# Patient Record
Sex: Female | Born: 1985 | Race: White | Hispanic: No | Smoking: Current every day smoker
Health system: Southern US, Community
[De-identification: ages and names within clinical notes are randomized; demographics above are authoritative.]

## PROBLEM LIST (undated history)

## (undated) DIAGNOSIS — T8859XA Other complications of anesthesia, initial encounter: Secondary | ICD-10-CM

## (undated) DIAGNOSIS — K219 Gastro-esophageal reflux disease without esophagitis: Secondary | ICD-10-CM

## (undated) DIAGNOSIS — T4145XA Adverse effect of unspecified anesthetic, initial encounter: Secondary | ICD-10-CM

## (undated) HISTORY — PX: BREAST SURGERY: SHX581

## (undated) HISTORY — PX: ABDOMINAL HYSTERECTOMY: SHX81

## (undated) HISTORY — PX: WISDOM TOOTH EXTRACTION: SHX21

---

## 2004-04-22 ENCOUNTER — Ambulatory Visit: Payer: Self-pay | Admitting: Family Medicine

## 2005-02-26 ENCOUNTER — Emergency Department: Payer: Self-pay | Admitting: Emergency Medicine

## 2006-11-01 ENCOUNTER — Ambulatory Visit: Payer: Self-pay

## 2007-04-30 ENCOUNTER — Inpatient Hospital Stay: Payer: Self-pay

## 2012-05-11 ENCOUNTER — Ambulatory Visit: Payer: Self-pay | Admitting: Internal Medicine

## 2012-10-19 ENCOUNTER — Ambulatory Visit: Payer: Self-pay | Admitting: Family Medicine

## 2013-12-20 ENCOUNTER — Ambulatory Visit: Payer: Self-pay | Admitting: Urology

## 2014-01-06 ENCOUNTER — Ambulatory Visit: Payer: Self-pay | Admitting: Unknown Physician Specialty

## 2014-06-09 LAB — SURGICAL PATHOLOGY

## 2014-11-13 IMAGING — CT CT ABD-PELV W/ CM
1 of 2 series · 15 of 32 positions shown, 19 images · IV contrast (isovue)
Comparison: None

REASON FOR EXAM: Generalized abdominal pain
COMMENTS:

PROCEDURE:     KCT - KCT ABDOMEN/PELVIS W  - October 19, 2012  [DATE]
RESULT:     History: Generalized abdominal pain
TECHNIQUE: Multiple axial images of the abdomen and pelvis were performed
from the lung bases to the pubic symphysis, with p.o. contrast and with 100
ml of Isovue 370 intravenous contrast.

[Series 2: abd 3mm w 3.0 i40f 3 · axial · 0.86mm/px · z∈[-1047,-618]mm · 15 of 161 slices shown, 19 images]
[im 12/161  soft-tissue]
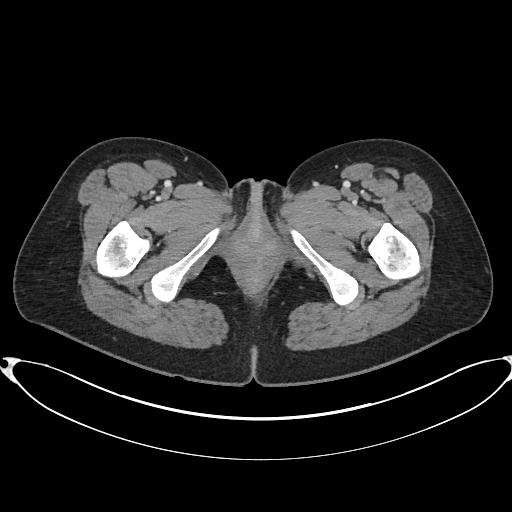
[im 12/161  bone]
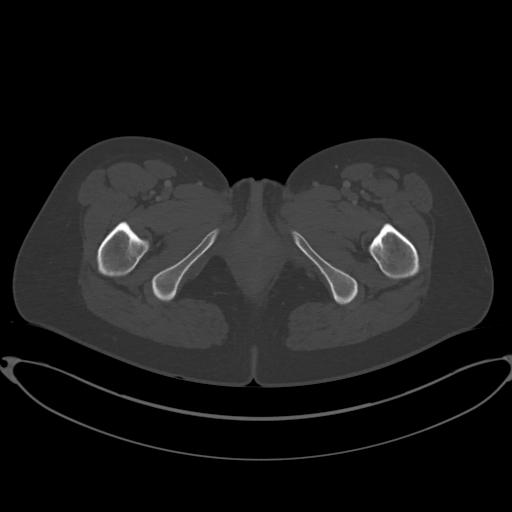
[im 24/161  soft-tissue]
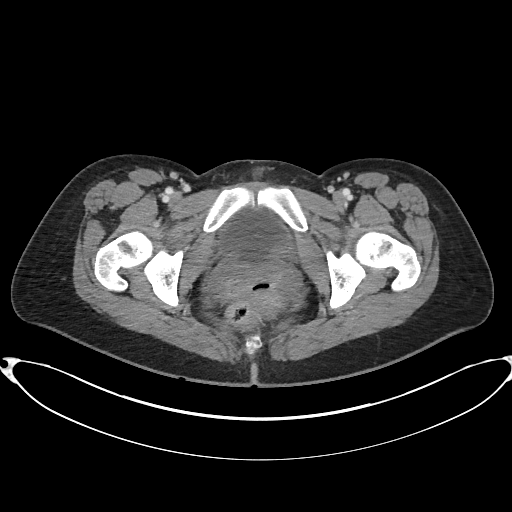
[im 36/161  soft-tissue]
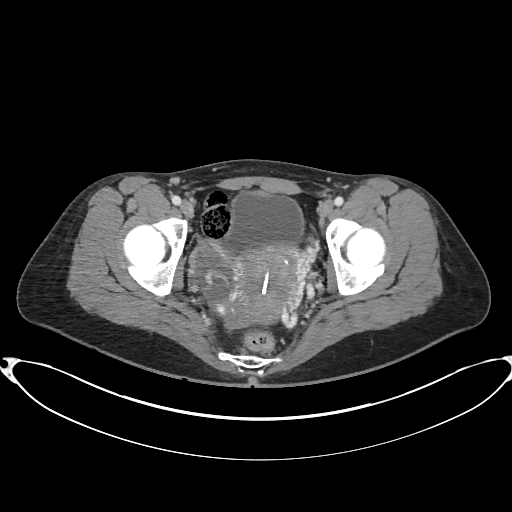
[im 48/161  soft-tissue]
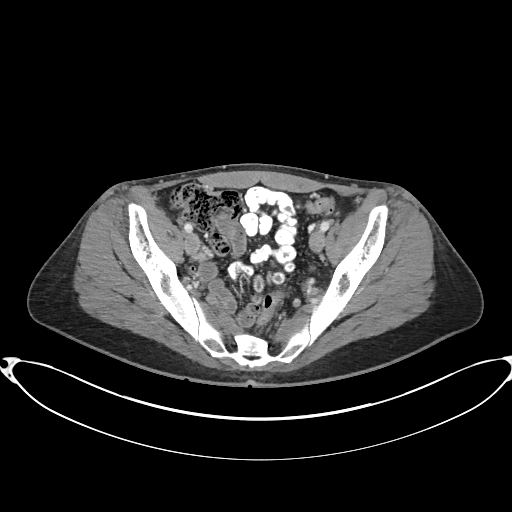
[im 60/161  soft-tissue]
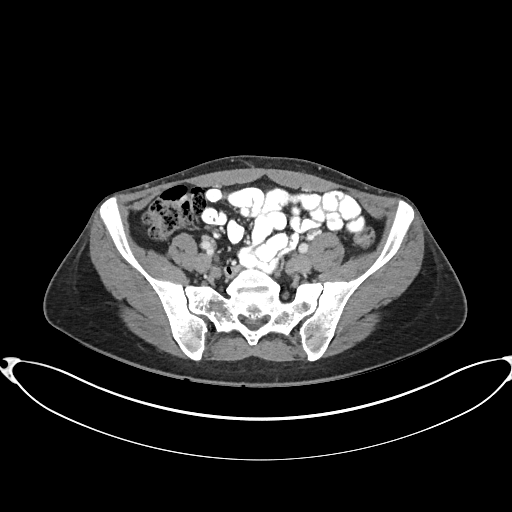
[im 72/161  soft-tissue]
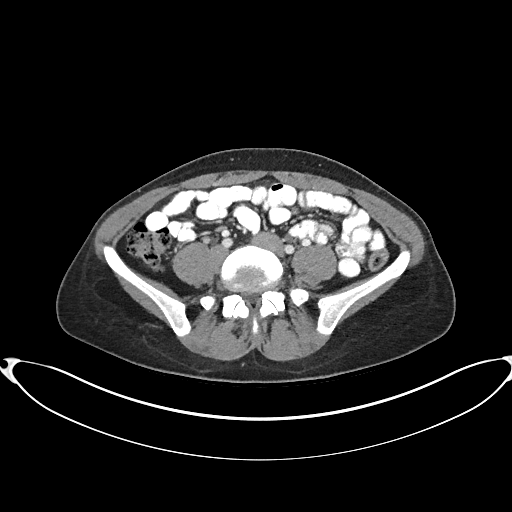
[im 83/161  soft-tissue]
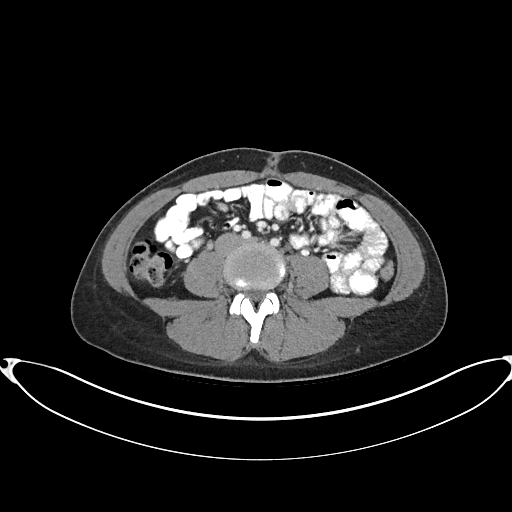
[im 95/161  soft-tissue]
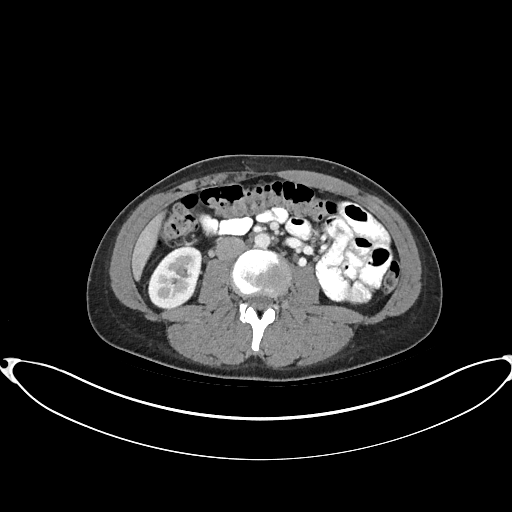
[im 107/161  soft-tissue]
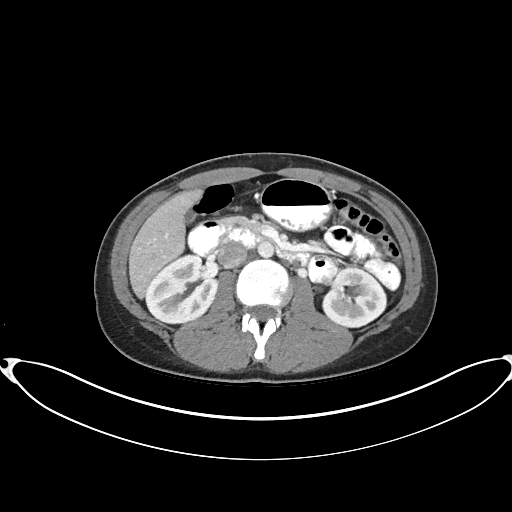
[im 107/161  bone]
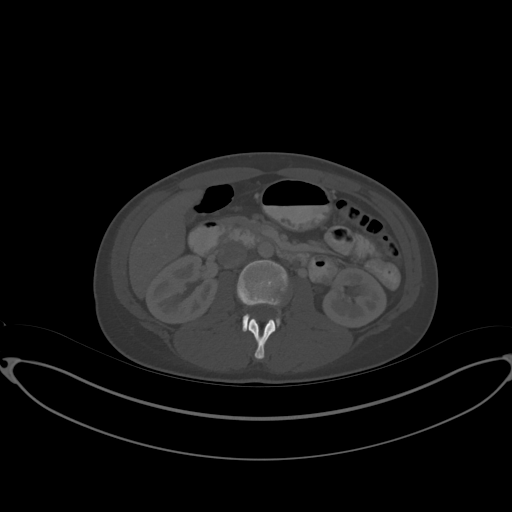
[im 119/161  soft-tissue]
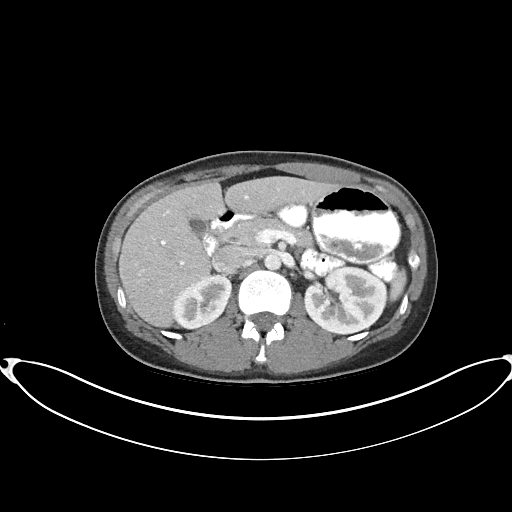
[im 131/161  soft-tissue]
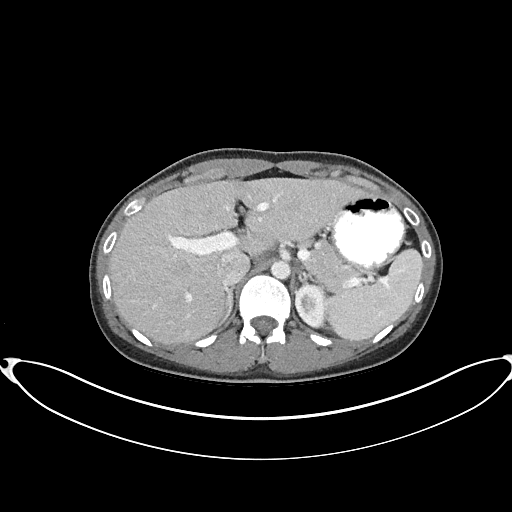
[im 137/161  lung]
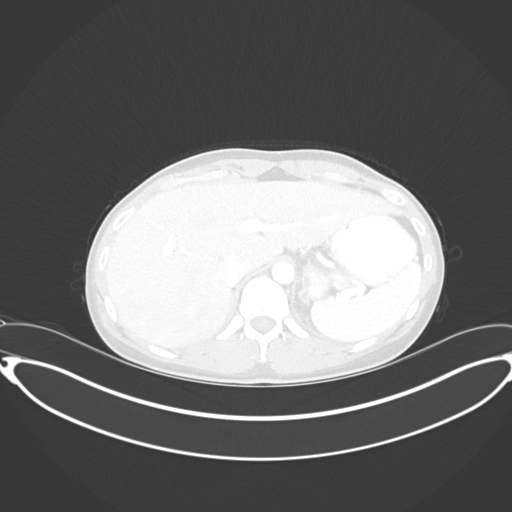
[im 143/161  soft-tissue]
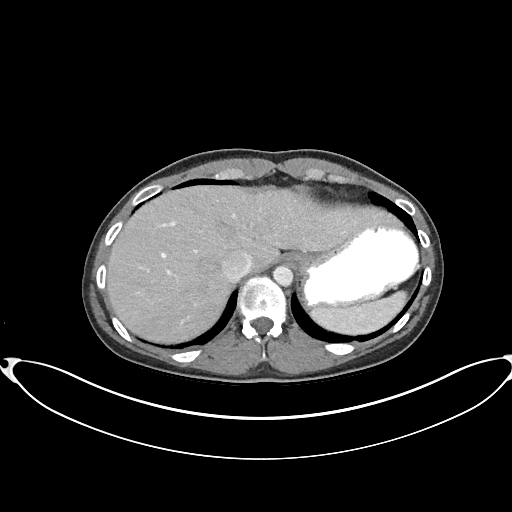
[im 143/161  lung]
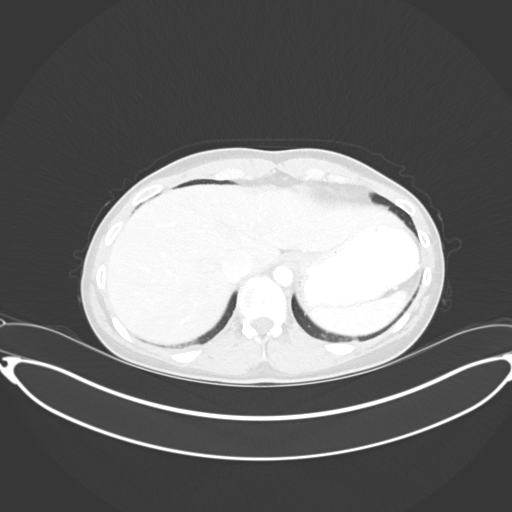
[im 149/161  lung]
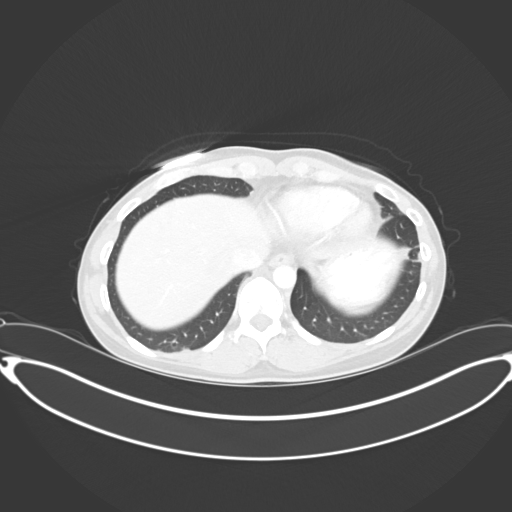
[im 155/161  soft-tissue]
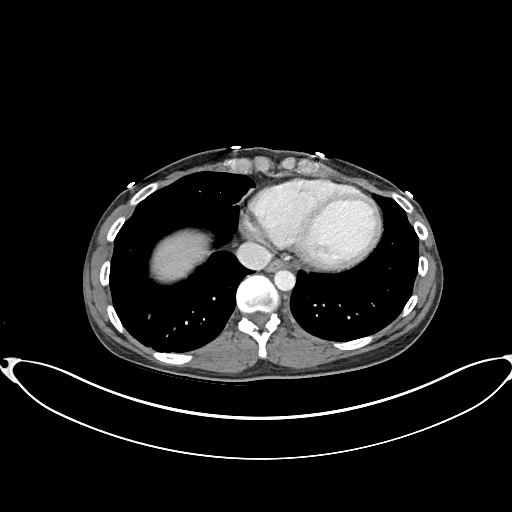
[im 155/161  lung]
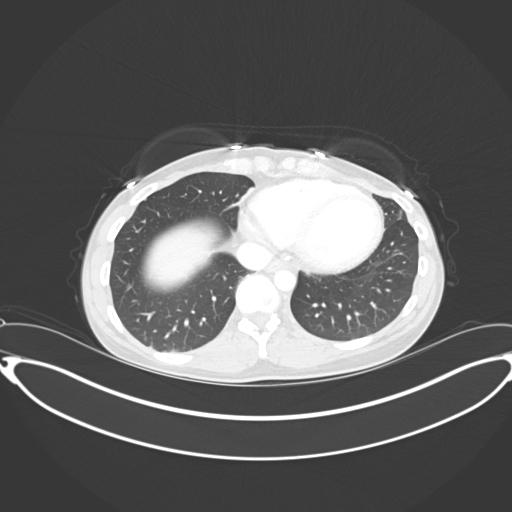

[15 of 32 positions shown; findings below may reference images not displayed]

FINDINGS: The lung bases are clear. There is no pneumothorax. The heart size is
normal.

The liver demonstrates no focal abnormality. There is no intrahepatic or
extrahepatic biliary ductal dilatation. The gallbladder is unremarkable. The
spleen demonstrates no focal abnormality. The kidneys, adrenal glands, and
pancreas are normal. The bladder is unremarkable.

The stomach, duodenum, small intestine, and large intestine demonstrate no
contrast extravasation or dilatation.  There is no pneumoperitoneum,
pneumatosis, or portal venous gas. There is no abdominal or pelvic free
fluid. There is no lymphadenopathy. There is a T type IUD noted.

The abdominal aorta is normal in caliber.

The osseous structures are unremarkable.
IMPRESSION: 1. No acute abdominal or pelvic pathology.

[REDACTED]

## 2016-03-28 IMAGING — CT CT ABDOMEN AND PELVIS WITHOUT AND WITH CONTRAST
2 of 7 series · 13 of 46 positions shown, 18 images · IV contrast (isovue)
Comparison: 10/19/2012

CLINICAL DATA: Worsening right flank pain and pelvic pain for
approximately 6 weeks. Dysuria.

EXAM:
CT ABDOMEN AND PELVIS WITHOUT AND WITH CONTRAST
TECHNIQUE: Multidetector CT imaging of the abdomen and pelvis was performed
following the standard protocol before and following the bolus
administration of intravenous contrast.
CONTRAST:  150 mL Isovue 370

[Series 5: cor hematuria < 45 wo · coronal · 0.70mm/px · 3 of 110 slices shown]
[im 28/110  soft-tissue]
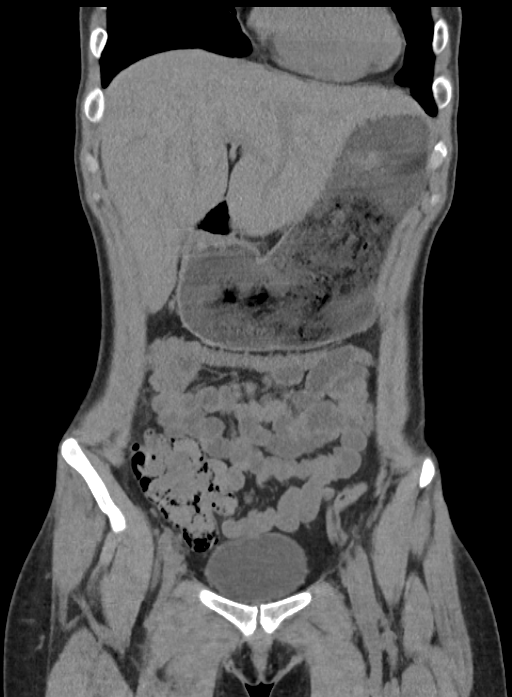
[im 55/110  soft-tissue]
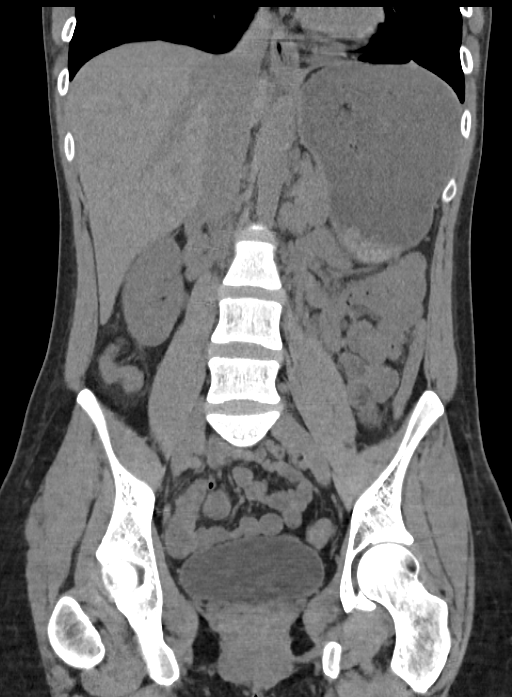
[im 82/110  soft-tissue]
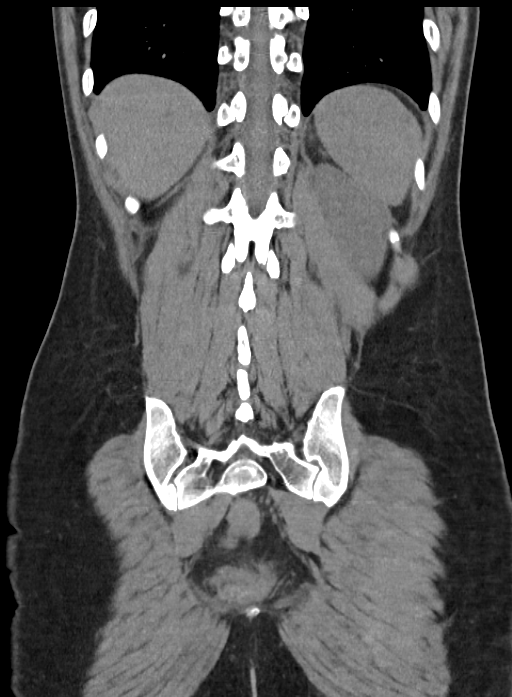

[Series 8: hematuria < 45 with · axial · 0.68mm/px · z∈[-985,-545]mm · 10 of 104 slices shown, 15 images]
[im 8/104  soft-tissue]
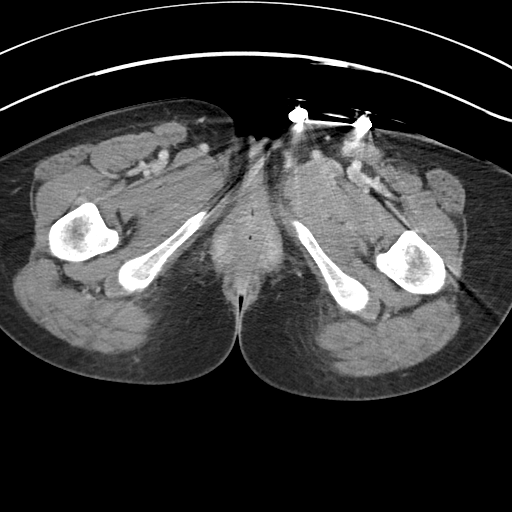
[im 8/104  bone]
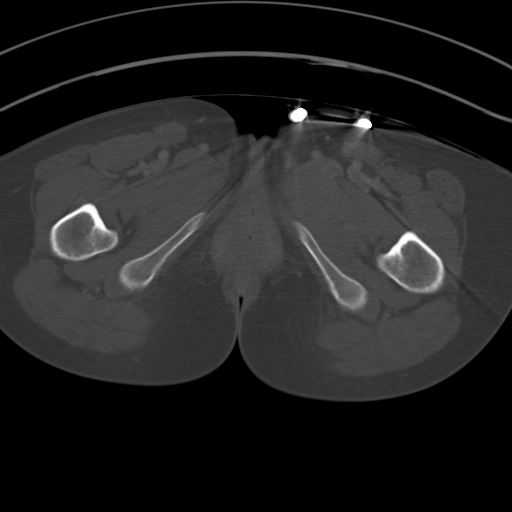
[im 23/104  soft-tissue]
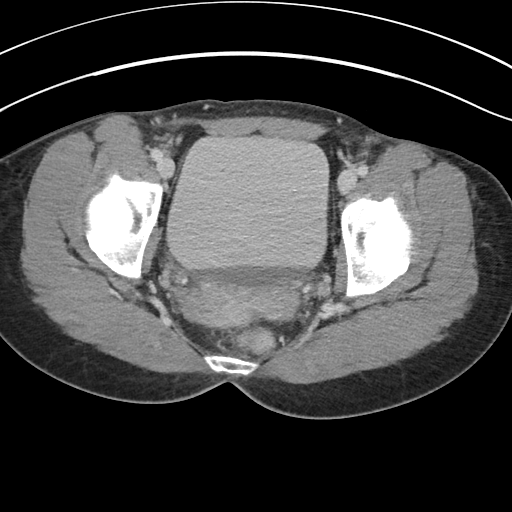
[im 30/104  soft-tissue]
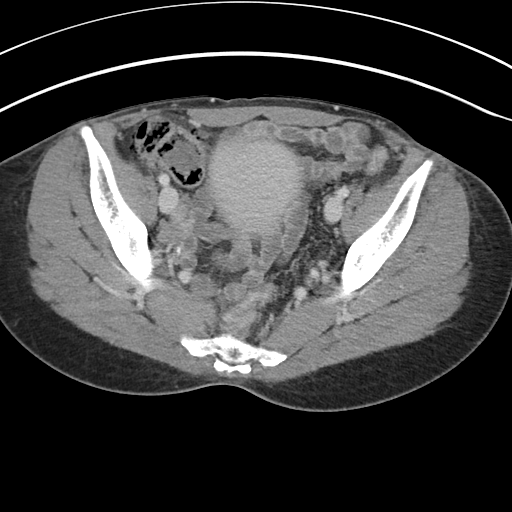
[im 45/104  soft-tissue]
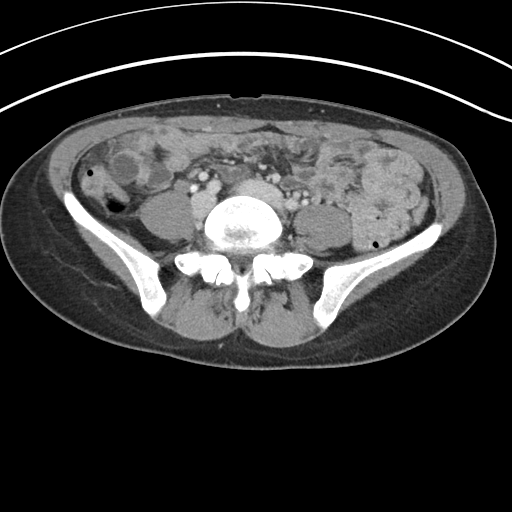
[im 52/104  soft-tissue]
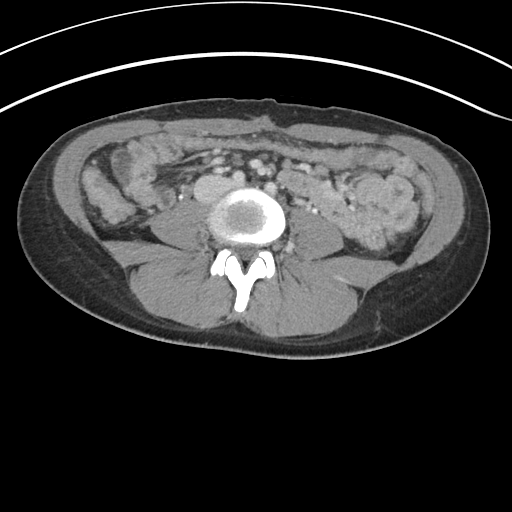
[im 59/104  soft-tissue]
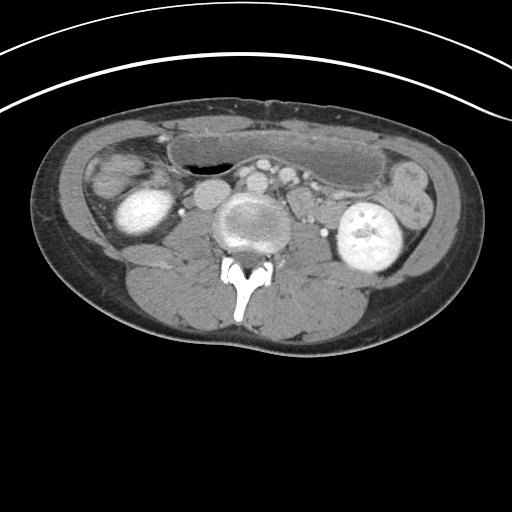
[im 74/104  soft-tissue]
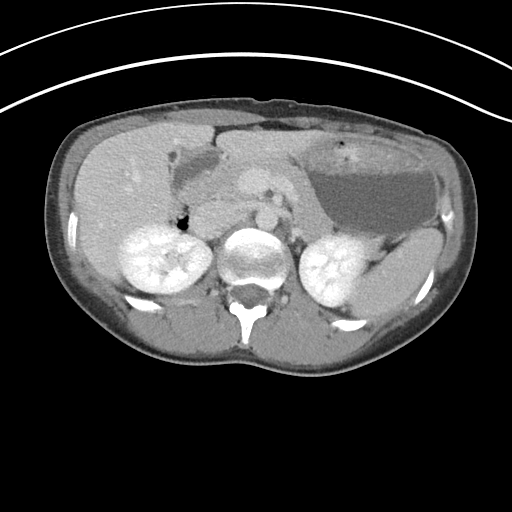
[im 74/104  lung]
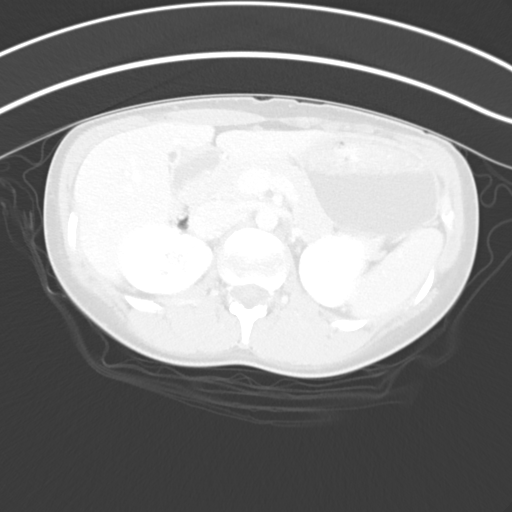
[im 81/104  soft-tissue]
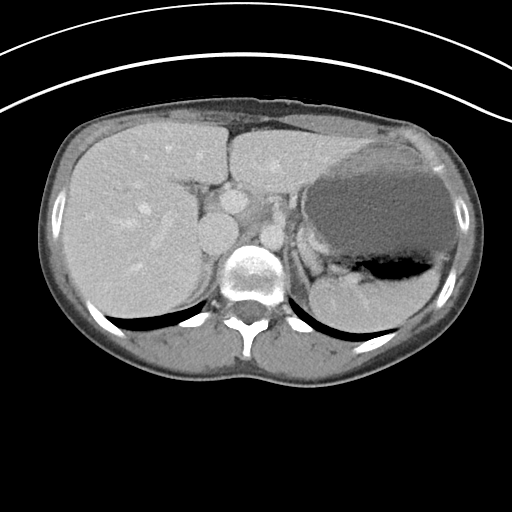
[im 81/104  lung]
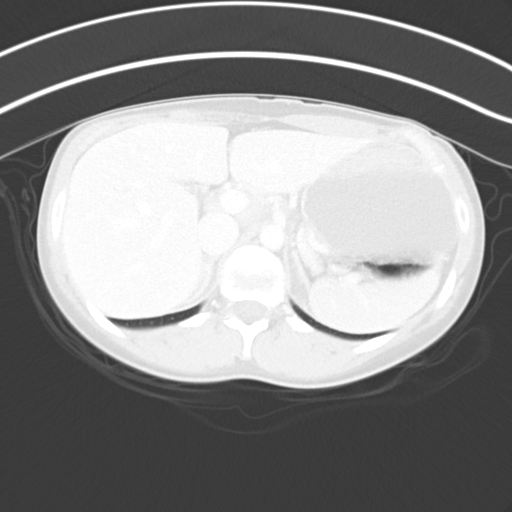
[im 89/104  lung]
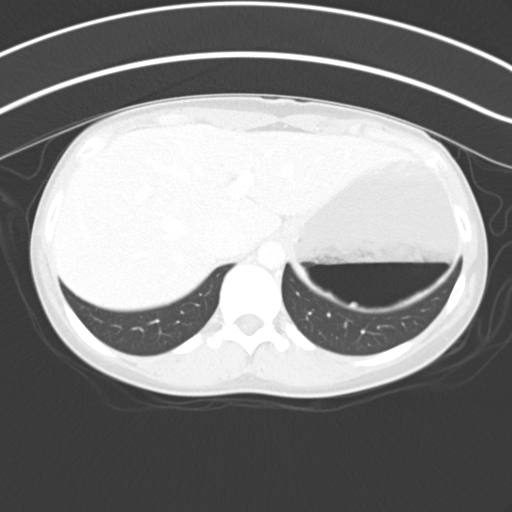
[im 96/104  soft-tissue]
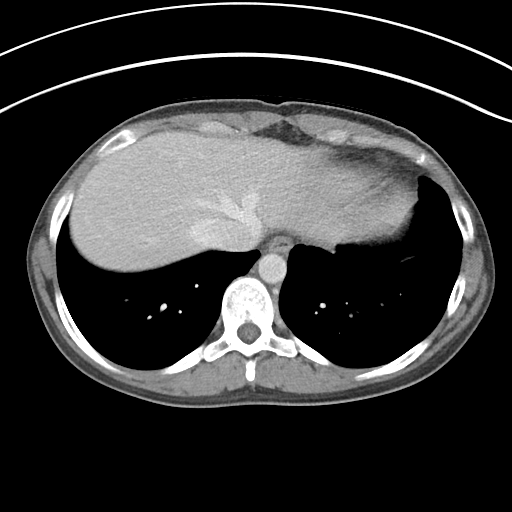
[im 96/104  lung]
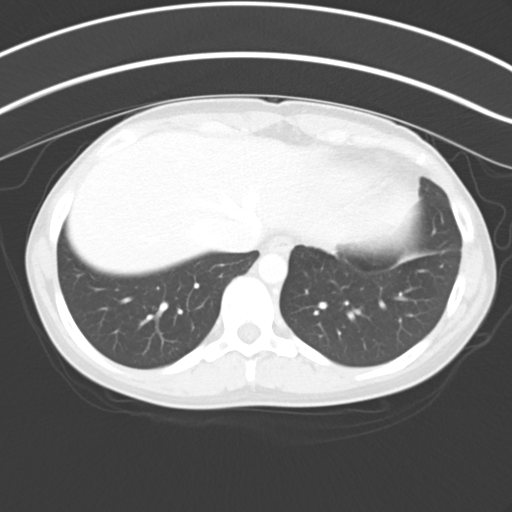
[im 96/104  bone]
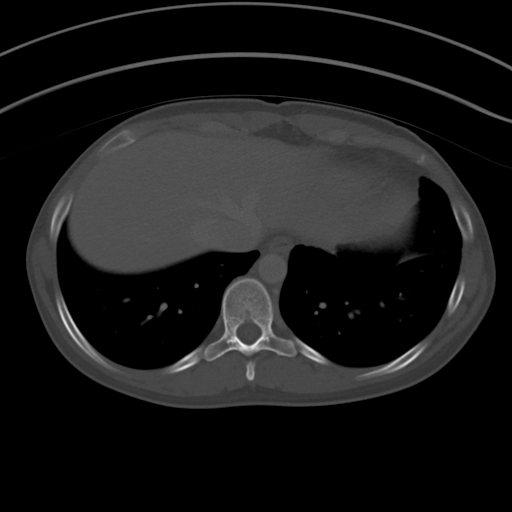

[13 of 46 positions shown; findings below may reference images not displayed]

FINDINGS: Lower chest:  Unremarkable.

Hepatobiliary: No masses or other significant abnormality
identified. Gallbladder is unremarkable.

Pancreas: No cystic or solid masses identified. No peripancreatic
inflammatory changes or fluid collections.

Spleen:  Within normal limits in size and appearance.

Adrenal Glands:  No masses identified.

Kidneys/Urinary Tract: No evidence of urolithiasis or
hydronephrosis. No complex cystic or solid renal masses identified.
No masses seen involving lower urinary tract.

Stomach/Bowel/Peritoneum: No evidence of wall thickening, mass, or
obstruction. Normal appendix is visualized.

Vascular/Lymphatic: No pathologically enlarged lymph nodes
identified. No other significant abnormality identified.

Reproductive:  No mass or other significant abnormality identified.

Other:  None.

Musculoskeletal:  No suspicious bone lesions identified.
IMPRESSION: Negative. No radiographic evidence of urinary tract neoplasm,
urolithiasis, or hydronephrosis.

## 2017-05-25 ENCOUNTER — Encounter: Payer: Self-pay | Admitting: Emergency Medicine

## 2017-05-25 ENCOUNTER — Ambulatory Visit
Admission: EM | Admit: 2017-05-25 | Discharge: 2017-05-25 | Disposition: A | Discharge status: Home / Self Care | Payer: BLUE CROSS/BLUE SHIELD | Attending: Family Medicine | Admitting: Family Medicine

## 2017-05-25 ENCOUNTER — Other Ambulatory Visit: Payer: Self-pay

## 2017-05-25 DIAGNOSIS — J029 Acute pharyngitis, unspecified: Secondary | ICD-10-CM | POA: Diagnosis not present

## 2017-05-25 DIAGNOSIS — R509 Fever, unspecified: Secondary | ICD-10-CM

## 2017-05-25 DIAGNOSIS — M791 Myalgia, unspecified site: Secondary | ICD-10-CM | POA: Diagnosis not present

## 2017-05-25 DIAGNOSIS — J111 Influenza due to unidentified influenza virus with other respiratory manifestations: Secondary | ICD-10-CM

## 2017-05-25 DIAGNOSIS — R69 Illness, unspecified: Principal | ICD-10-CM

## 2017-05-25 LAB — RAPID STREP SCREEN (MED CTR MEBANE ONLY): STREPTOCOCCUS, GROUP A SCREEN (DIRECT): NEGATIVE

## 2017-05-25 MED ORDER — OSELTAMIVIR PHOSPHATE 75 MG PO CAPS: 75.0000 mg | ORAL_CAPSULE | Freq: Two times a day (BID) | ORAL | 0 refills | Status: DC

## 2017-05-25 NOTE — ED Provider Notes (Signed)
[facility]  CSN: [id number] Arrival date & time: 05/25/17  1211  History   Chief Complaint Chief Complaint  Patient presents with  . Sore Throat   HPI  32 year old female presents with sore throat, body aches, fever, congestion.  Patient reports she is been sick since yesterday.  Began with sore throat.  Has now progressed.  She has continued sore throat, body aches, chills, fever, headaches, congestion.  She taken ibuprofen without relief.  No known exacerbating factors.  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  No reported recent sick contacts.  Symptoms are severe.  No other associated symptoms.  No other complaints.  PMH: Heart murmur, unspecified 2004 mitral valve prolapse, prior cardiac work-up in childhood  Weight loss 2011 negative GI work-up  Endometriosis of uterus     Past Surgical History:  Procedure Laterality Date  . ABDOMINAL HYSTERECTOMY    . BREAST SURGERY    . WISDOM TOOTH EXTRACTION      OB History   None    Home Medications    Prior to Admission medications   Medication Sig Start Date End Date Taking? Authorizing Provider  busPIRone (BUSPAR) 15 MG tablet Take by mouth. 04/14/17 04/14/18 Yes [provider]  escitalopram (LEXAPRO) 10 MG tablet Take by mouth. 03/10/17 06/08/17 Yes [provider]  zolpidem (AMBIEN CR) 6.25 MG CR tablet Take by mouth. 05/01/17 05/31/17 Yes [provider]  oseltamivir (TAMIFLU) 75 MG capsule Take 1 capsule (75 mg total) by mouth every 12 (twelve) hours. 05/25/17   Tommie Sams, DO   Family History Family History  Problem Relation Age of Onset  . Thyroid disease Mother    Social History Social History   Tobacco Use  . Smoking status: Current Every Day Smoker    Types: Cigarettes  . Smokeless tobacco: Never Used  Substance Use Topics  . Alcohol use: Never    Frequency: Never  . Drug use: Not on file   Allergies   Patient has no known allergies.   Review of  Systems Review of Systems  Constitutional: Positive for chills and fever.  HENT: Positive for congestion and sore throat.   Musculoskeletal:       Bodyaches.   Physical Exam Triage Vital Signs ED Triage Vitals  Enc Vitals Group     BP 05/25/17 1229 116/78     Pulse Rate 05/25/17 1229 95     Resp 05/25/17 1229 16     Temp 05/25/17 1229 (!) 101.8 F (38.8 C)     Temp Source 05/25/17 1229 Oral     SpO2 05/25/17 1229 100 %     Weight 05/25/17 1224 158 lb (71.7 kg)     Height 05/25/17 1224 5\' 9"  (1.753 m)     Head Circumference --      Peak Flow --      Pain Score 05/25/17 1224 5     Pain Loc --      Pain Edu? --      Excl. in GC? --    Updated Vital Signs BP 116/78 (BP Location: Left Arm)   Pulse 95   Temp (!) 101.8 F (38.8 C) (Oral) Comment: Patient states that she took Ibuprogen around 6am this morning. Patient states that she does not want any Tylenol that she will wait till she gets home.   Resp 16   Ht 5\' 9"  (1.753 m)   Wt 158 lb (71.7 kg)   SpO2 100%  BMI 23.33 kg/m  Physical Exam  Constitutional: She is oriented to person, place, and time. She appears well-developed. No distress.  HENT:  Head: Normocephalic and atraumatic.  Nose: Nose normal.  Oropharynx with erythema.  Right tonsillar exudate noted.  Cardiovascular: Normal rate and regular rhythm.  Pulmonary/Chest: Effort normal and breath sounds normal. She has no wheezes. She has no rales.  Neurological: She is alert and oriented to person, place, and time.  Psychiatric: She has a normal mood and affect. Her behavior is normal.  Nursing note and vitals reviewed.  UC Treatments / Results  Labs (all labs ordered are listed, but only abnormal results are displayed) Labs Reviewed  RAPID STREP SCREEN ([facility] ONLY)  CULTURE, GROUP A STREP [facility])    EKG None Radiology No results found.  Procedures Procedures (including critical care time)  Medications Ordered in UC Medications - No data to  display   Initial Impression / Assessment and Plan / UC Course  I have reviewed the triage vital signs and the nursing notes.  Pertinent labs & imaging results that were available during my care of the patient were reviewed by me and considered in my medical decision making (see chart for details).     32 year old female presents with suspected influenza.  Her rapid strep was negative.  Awaiting culture.  Placing on Tamiflu.  Out of work.  Final Clinical Impressions(s) / UC Diagnoses   Final diagnoses:  Influenza-like illness    ED Discharge Orders        Ordered    oseltamivir (TAMIFLU) 75 MG capsule  Every 12 hours     05/25/17 1245     Controlled Substance Prescriptions [place] Controlled Substance Registry consulted? Not Applicable   Tommie SamsCook, Kalisi Bevill G, DO 05/25/17 1303

## 2017-05-25 NOTE — ED Triage Notes (Signed)
Patient c/o sore throat, bodyaches, chills, and HAs that started yesterday.  Patient denies fevers.

## 2017-05-25 NOTE — Discharge Instructions (Signed)
We will call with the strep culture results if positive.  Start the tamiflu.  Tylenol and motrin as needed.  Take care  Dr. Adriana Simasook

## 2017-05-28 LAB — CULTURE, GROUP A STREP (THRC)

## 2017-11-08 ENCOUNTER — Other Ambulatory Visit
Admission: RE | Admit: 2017-11-08 | Discharge: 2017-11-08 | Disposition: A | Discharge status: Home / Self Care | Payer: BLUE CROSS/BLUE SHIELD | Source: Ambulatory Visit | Attending: Internal Medicine | Admitting: Internal Medicine

## 2017-11-08 DIAGNOSIS — R0789 Other chest pain: Secondary | ICD-10-CM | POA: Diagnosis present

## 2017-11-08 LAB — FIBRIN DERIVATIVES D-DIMER (ARMC ONLY): FIBRIN DERIVATIVES D-DIMER (ARMC): 390.1 ng{FEU}/mL (ref 0.00–499.00)

## 2018-03-21 ENCOUNTER — Encounter: Payer: Self-pay | Admitting: Anesthesiology

## 2018-03-22 NOTE — Discharge Instructions (Signed)
[facility] °[facility] °ENDOSCOPIC SINUS SURGERY °[facility] ° °What is Functional Endoscopic Sinus Surgery? ° The Surgery involves making the natural openings of the sinuses larger by removing the bony partitions that separate the sinuses from the nasal cavity.  The natural sinus lining is preserved as much as possible to allow the sinuses to resume normal function after the surgery.  In some patients nasal polyps (excessively swollen lining of the sinuses) may be removed to relieve obstruction of the sinus openings.  The surgery is performed through the nose using lighted scopes, which eliminates the need for incisions on the face.  A septoplasty is a different procedure which is sometimes performed with sinus surgery.  It involves straightening the boy partition that separates the two sides of your nose.  A crooked or deviated septum may need repair if is obstructing the sinuses or nasal airflow.  Turbinate reduction is also often performed during sinus surgery.  The turbinates are bony proturberances from the side walls of the nose which swell and can obstruct the nose in patients with sinus and allergy problems.  Their size can be surgically reduced to help relieve nasal obstruction. ° °What Can Sinus Surgery Do For Me? ° Sinus surgery can reduce the frequency of sinus infections requiring antibiotic treatment.  This can provide improvement in nasal congestion, post-nasal drainage, facial pressure and nasal obstruction.  Surgery will NOT prevent you from ever having an infection again, so it usually only for patients who get infections 4 or more times yearly requiring antibiotics, or for infections that do not clear with antibiotics.  It will not cure nasal allergies, so patients with allergies may still require medication to treat their allergies after surgery. Surgery may improve headaches related to sinusitis, however, some people will continue to  require medication to control sinus headaches related to allergies.  Surgery will do nothing for other forms of headache (migraine, tension or cluster). ° °What Are the Risks of Endoscopic Sinus Surgery? ° Current techniques allow surgery to be performed safely with little risk, however, there are rare complications that patients should be aware of.  Because the sinuses are located around the eyes, there is risk of eye injury, including blindness, though again, this would be quite rare. This is usually a result of bleeding behind the eye during surgery, which puts the vision oat risk, though there are treatments to protect the vision and prevent permanent disrupted by surgery causing a leak of the spinal fluid that surrounds the brain.  More serious complications would include bleeding inside the brain cavity or damage to the brain.  Again, all of these complications are uncommon, and spinal fluid leaks can be safely managed surgically if they occur.  The most common complication of sinus surgery is bleeding from the nose, which may require packing or cauterization of the nose.  Continued sinus have polyps may experience recurrence of the polyps requiring revision surgery.  Alterations of sense of smell or injury to the tear ducts are also rare complications.  ° °What is the Surgery Like, and what is the Recovery? ° The Surgery usually takes a couple of hours to perform, and is usually performed under a general anesthetic (completely asleep).  Patients are usually discharged home after a couple of hours.  Sometimes during surgery it is necessary to pack the nose to control bleeding, and the packing is left in place for 24 - 48 hours, and removed by your surgeon.    If a septoplasty was performed during the procedure, there is often a splint placed which must be removed after 5-7 days.   °Discomfort: Pain is usually mild to moderate, and can be controlled by prescription pain medication or acetaminophen (Tylenol).   Aspirin, Ibuprofen (Advil, Motrin), or Naprosyn (Aleve) should be avoided, as they can cause increased bleeding.  Most patients feel sinus pressure like they have a bad head cold for several days.  Sleeping with your head elevated can help reduce swelling and facial pressure, as can ice packs over the face.  A humidifier may be helpful to keep the mucous and blood from drying in the nose.  ° °Diet: There are no specific diet restrictions, however, you should generally start with clear liquids and a light diet of bland foods because the anesthetic can cause some nausea.  Advance your diet depending on how your stomach feels.  Taking your pain medication with food will often help reduce stomach upset which pain medications can cause. ° °Nasal Saline Irrigation: It is important to remove blood clots and dried mucous from the nose as it is healing.  This is done by having you irrigate the nose at least 3 - 4 times daily with a salt water solution.  We recommend using NeilMed Sinus Rinse (available at the drug store).  Fill the squeeze bottle with the solution, bend over a sink, and insert the tip of the squeeze bottle into the nose ½ of an inch.  Point the tip of the squeeze bottle towards the inside corner of the eye on the same side your irrigating.  Squeeze the bottle and gently irrigate the nose.  If you bend forward as you do this, most of the fluid will flow back out of the nose, instead of down your throat.   The solution should be warm, near body temperature, when you irrigate.   Each time you irrigate, you should use a full squeeze bottle.  ° °Note that if you are instructed to use Nasal Steroid Sprays at any time after your surgery, irrigate with saline BEFORE using the steroid spray, so you do not wash it all out of the nose. °Another product, Nasal Saline Gel (such as AYR Nasal Saline Gel) can be applied in each nostril 3 - 4 times daily to moisture the nose and reduce scabbing or crusting. ° °Bleeding:   Bloody drainage from the nose can be expected for several days, and patients are instructed to irrigate their nose frequently with salt water to help remove mucous and blood clots.  The drainage may be dark red or brown, though some fresh blood may be seen intermittently, especially after irrigation.  Do not blow you nose, as bleeding may occur. If you must sneeze, keep your mouth open to allow air to escape through your mouth. ° °If heavy bleeding occurs: Irrigate the nose with saline to rinse out clots, then spray the nose 3 - 4 times with Afrin Nasal Decongestant Spray.  The spray will constrict the blood vessels to slow bleeding.  Pinch the lower half of your nose shut to apply pressure, and lay down with your head elevated.  Ice packs over the nose may help as well. If bleeding persists despite these measures, you should notify your doctor.  Do not use the Afrin routinely to control nasal congestion after surgery, as it can result in worsening congestion and may affect healing.  ° °Activity: Return to work varies among patients. Most patients will be out of   work at least 5 - 7 days to recover.  Patient may return to work after they are off of narcotic pain medication, and feeling well enough to perform the functions of their job.  Patients must avoid heavy lifting (over 10 pounds) or strenuous physical for 2 weeks after surgery, so your employer may need to assign you to light duty, or keep you out of work longer if light duty is not possible.  NOTE: you should not drive, operate dangerous machinery, do any mentally demanding tasks or make any important legal or financial decisions while on narcotic pain medication and recovering from the general anesthetic.  °  °Call Your Doctor Immediately if You Have Any of the Following: °1. Bleeding that you cannot control with the above measures °2. Loss of vision, double vision, bulging of the eye or black eyes. °3. Fever over 101 degrees °4. Neck stiffness with severe  headache, fever, nausea and change in mental state. °You are always encourage to call anytime with concerns, however, please call with requests for pain medication refills during office hours. ° °Office Endoscopy: During follow-up visits your doctor will remove any packing or splints that may have been placed and evaluate and clean your sinuses endoscopically.  Topical anesthetic will be used to make this as comfortable as possible, though you may want to take your pain medication prior to the visit.  How often this will need to be done varies from patient to patient.  After complete recovery from the surgery, you may need follow-up endoscopy from time to time, particularly if there is concern of recurrent infection or nasal polyps. ° ° °General Anesthesia, Adult, Care After °This sheet gives you information about how to care for yourself after your procedure. Your health care provider may also give you more specific instructions. If you have problems or questions, contact your health care provider. °What can I expect after the procedure? °After the procedure, the following side effects are common: °· Pain or discomfort at the IV site. °· Nausea. °· Vomiting. °· Sore throat. °· Trouble concentrating. °· Feeling cold or chills. °· Weak or tired. °· Sleepiness and fatigue. °· Soreness and body aches. These side effects can affect parts of the body that were not involved in surgery. °Follow these instructions at home: ° °For at least 24 hours after the procedure: °· Have a responsible adult stay with you. It is important to have someone help care for you until you are awake and alert. °· Rest as needed. °· Do not: °? Participate in activities in which you could fall or become injured. °? Drive. °? Use heavy machinery. °? Drink alcohol. °? Take sleeping pills or medicines that cause drowsiness. °? Make important decisions or sign legal documents. °? Take care of children on your own. °Eating and drinking °· Follow any  instructions from your health care provider about eating or drinking restrictions. °· When you feel hungry, start by eating small amounts of foods that are soft and easy to digest (bland), such as toast. Gradually return to your regular diet. °· Drink enough fluid to keep your urine pale yellow. °· If you vomit, rehydrate by drinking water, juice, or clear broth. °General instructions °· If you have sleep apnea, surgery and certain medicines can increase your risk for breathing problems. Follow instructions from your health care provider about wearing your sleep device: °? Anytime you are sleeping, including during daytime naps. °? While taking prescription pain medicines, sleeping medicines, or medicines that make   you drowsy. °· Return to your normal activities as told by your health care provider. Ask your health care provider what activities are safe for you. °· Take over-the-counter and prescription medicines only as told by your health care provider. °· If you smoke, do not smoke without supervision. °· Keep all follow-up visits as told by your health care provider. This is important. °Contact a health care provider if: °· You have nausea or vomiting that does not get better with medicine. °· You cannot eat or drink without vomiting. °· You have pain that does not get better with medicine. °· You are unable to pass urine. °· You develop a skin rash. °· You have a fever. °· You have redness around your IV site that gets worse. °Get help right away if: °· You have difficulty breathing. °· You have chest pain. °· You have blood in your urine or stool, or you vomit blood. °Summary °· After the procedure, it is common to have a sore throat or nausea. It is also common to feel tired. °· Have a responsible adult stay with you for the first 24 hours after general anesthesia. It is important to have someone help care for you until you are awake and alert. °· When you feel hungry, start by eating small amounts of foods  that are soft and easy to digest (bland), such as toast. Gradually return to your regular diet. °· Drink enough fluid to keep your urine pale yellow. °· Return to your normal activities as told by your health care provider. Ask your health care provider what activities are safe for you. °This information is not intended to replace advice given to you by your health care provider. Make sure you discuss any questions you have with your health care provider. °Document Released: 05/09/2000 Document Revised: 09/16/2016 Document Reviewed: 09/16/2016 °Elsevier Interactive Patient Education © 2019 [employer] ° °

## 2018-03-29 ENCOUNTER — Ambulatory Visit
Admission: RE | Admit: 2018-03-29 | Discharge: 2018-03-29 | Disposition: A | Discharge status: Home / Self Care | Payer: BLUE CROSS/BLUE SHIELD | Attending: Otolaryngology | Admitting: Otolaryngology

## 2018-03-29 ENCOUNTER — Ambulatory Visit: Payer: BLUE CROSS/BLUE SHIELD | Admitting: Anesthesiology

## 2018-03-29 ENCOUNTER — Encounter
Admission: RE | Disposition: A | Discharge status: Home / Self Care | Payer: Self-pay | Source: Home / Self Care | Attending: Otolaryngology

## 2018-03-29 DIAGNOSIS — Z7951 Long term (current) use of inhaled steroids: Secondary | ICD-10-CM | POA: Insufficient documentation

## 2018-03-29 DIAGNOSIS — J343 Hypertrophy of nasal turbinates: Secondary | ICD-10-CM | POA: Diagnosis not present

## 2018-03-29 DIAGNOSIS — K219 Gastro-esophageal reflux disease without esophagitis: Secondary | ICD-10-CM | POA: Insufficient documentation

## 2018-03-29 DIAGNOSIS — Z79899 Other long term (current) drug therapy: Secondary | ICD-10-CM | POA: Diagnosis not present

## 2018-03-29 DIAGNOSIS — F17210 Nicotine dependence, cigarettes, uncomplicated: Secondary | ICD-10-CM | POA: Diagnosis not present

## 2018-03-29 DIAGNOSIS — J3489 Other specified disorders of nose and nasal sinuses: Secondary | ICD-10-CM | POA: Diagnosis not present

## 2018-03-29 DIAGNOSIS — J342 Deviated nasal septum: Secondary | ICD-10-CM | POA: Diagnosis not present

## 2018-03-29 HISTORY — DX: Adverse effect of unspecified anesthetic, initial encounter: T41.45XA

## 2018-03-29 HISTORY — PX: TURBINATE REDUCTION: SHX6157

## 2018-03-29 HISTORY — DX: Other complications of anesthesia, initial encounter: T88.59XA

## 2018-03-29 HISTORY — DX: Gastro-esophageal reflux disease without esophagitis: K21.9

## 2018-03-29 HISTORY — PX: SEPTOPLASTY: SHX2393

## 2018-03-29 SURGERY — SEPTOPLASTY, NOSE
Anesthesia: General | Site: Nose | Laterality: Bilateral

## 2018-03-29 MED ORDER — OXYMETAZOLINE HCL 0.05 % NA SOLN
2.0000 | Freq: Once | NASAL | Status: AC
  Administered 2018-03-29: 2 via NASAL

## 2018-03-29 MED ORDER — LIDOCAINE HCL (CARDIAC) PF 100 MG/5ML IV SOSY
PREFILLED_SYRINGE | INTRAVENOUS | Status: DC | PRN
  Administered 2018-03-29: 40 mg via INTRAVENOUS

## 2018-03-29 MED ORDER — FENTANYL CITRATE (PF) 100 MCG/2ML IJ SOLN
25.0000 ug | INTRAMUSCULAR | Status: DC | PRN
  Administered 2018-03-29: 25 ug via INTRAVENOUS

## 2018-03-29 MED ORDER — PROPOFOL 10 MG/ML IV BOLUS
INTRAVENOUS | Status: DC | PRN
  Administered 2018-03-29: 150 mg via INTRAVENOUS
  Administered 2018-03-29: 50 mg via INTRAVENOUS
  Administered 2018-03-29: 40 mg via INTRAVENOUS

## 2018-03-29 MED ORDER — FENTANYL CITRATE (PF) 100 MCG/2ML IJ SOLN
INTRAMUSCULAR | Status: DC | PRN
  Administered 2018-03-29 (×2): 50 ug via INTRAVENOUS

## 2018-03-29 MED ORDER — GLYCOPYRROLATE 0.2 MG/ML IJ SOLN
INTRAMUSCULAR | Status: DC | PRN
  Administered 2018-03-29: 0.1 mg via INTRAVENOUS

## 2018-03-29 MED ORDER — PHENYLEPHRINE HCL 0.5 % NA SOLN
NASAL | Status: DC | PRN
  Administered 2018-03-29: 08:00:00 via TOPICAL

## 2018-03-29 MED ORDER — SUCCINYLCHOLINE CHLORIDE 20 MG/ML IJ SOLN
INTRAMUSCULAR | Status: DC | PRN
  Administered 2018-03-29: 100 mg via INTRAVENOUS

## 2018-03-29 MED ORDER — ACETAMINOPHEN 10 MG/ML IV SOLN
1000.0000 mg | Freq: Once | INTRAVENOUS | Status: AC
  Administered 2018-03-29: 1000 mg via INTRAVENOUS

## 2018-03-29 MED ORDER — PREDNISONE 10 MG PO TABS: ORAL_TABLET | ORAL | 0 refills | Status: DC

## 2018-03-29 MED ORDER — OXYCODONE HCL 5 MG PO TABS
5.0000 mg | ORAL_TABLET | Freq: Once | ORAL | Status: AC | PRN
  Administered 2018-03-29: 5 mg via ORAL

## 2018-03-29 MED ORDER — DEXTROSE 5 % IV SOLN
2000.0000 mg | Freq: Once | INTRAVENOUS | Status: AC
  Administered 2018-03-29: 2000 mg via INTRAVENOUS

## 2018-03-29 MED ORDER — ONDANSETRON HCL 4 MG/2ML IJ SOLN
4.0000 mg | Freq: Once | INTRAMUSCULAR | Status: AC | PRN
  Administered 2018-03-29: 4 mg via INTRAVENOUS

## 2018-03-29 MED ORDER — CEPHALEXIN 500 MG PO CAPS: 500.0000 mg | ORAL_CAPSULE | Freq: Two times a day (BID) | ORAL | 0 refills | Status: DC

## 2018-03-29 MED ORDER — SCOPOLAMINE 1 MG/3DAYS TD PT72
1.0000 | MEDICATED_PATCH | Freq: Once | TRANSDERMAL | Status: DC
  Administered 2018-03-29: 1.5 mg via TRANSDERMAL

## 2018-03-29 MED ORDER — LACTATED RINGERS IV SOLN
INTRAVENOUS | Status: DC
  Administered 2018-03-29: 07:00:00 via INTRAVENOUS

## 2018-03-29 MED ORDER — OXYCODONE HCL 5 MG/5ML PO SOLN: 5.0000 mg | Freq: Once | ORAL | Status: AC | PRN

## 2018-03-29 MED ORDER — MIDAZOLAM HCL 5 MG/5ML IJ SOLN
INTRAMUSCULAR | Status: DC | PRN
  Administered 2018-03-29: 2 mg via INTRAVENOUS

## 2018-03-29 MED ORDER — LIDOCAINE-EPINEPHRINE 1 %-1:100000 IJ SOLN
INTRAMUSCULAR | Status: DC | PRN
  Administered 2018-03-29: 6 mL
  Administered 2018-03-29: 4 mL

## 2018-03-29 MED ORDER — HYDROCODONE-ACETAMINOPHEN 5-325 MG PO TABS: 1.0000 | ORAL_TABLET | Freq: Four times a day (QID) | ORAL | 0 refills | Status: AC | PRN

## 2018-03-29 MED ORDER — DEXAMETHASONE SODIUM PHOSPHATE 4 MG/ML IJ SOLN
INTRAMUSCULAR | Status: DC | PRN
  Administered 2018-03-29: 10 mg via INTRAVENOUS

## 2018-03-29 SURGICAL SUPPLY — 30 items
CANISTER SUCT 1200ML W/VALVE (MISCELLANEOUS) ×3 IMPLANT
COAGULATOR SUCT 8FR VV (MISCELLANEOUS) ×3 IMPLANT
DRAPE HEAD BAR (DRAPES) ×3 IMPLANT
ELECT REM PT RETURN 9FT ADLT (ELECTROSURGICAL) ×3
ELECTRODE REM PT RTRN 9FT ADLT (ELECTROSURGICAL) ×1 IMPLANT
GLOVE PI ULTRA LF STRL 7.5 (GLOVE) ×2 IMPLANT
GLOVE PI ULTRA NON LATEX 7.5 (GLOVE) ×4
GOWN STRL REUS W/ TWL LRG LVL3 (GOWN DISPOSABLE) ×1 IMPLANT
GOWN STRL REUS W/TWL LRG LVL3 (GOWN DISPOSABLE) ×2
KIT TURNOVER KIT A (KITS) ×3 IMPLANT
NEEDLE ANESTHESIA  27G X 3.5 (NEEDLE) ×2
NEEDLE ANESTHESIA 27G X 3.5 (NEEDLE) ×1 IMPLANT
NEEDLE HYPO 25GX1X1/2 BEV (NEEDLE) ×3 IMPLANT
NEEDLE HYPO 27GX1-1/4 (NEEDLE) ×3 IMPLANT
PACK ENT CUSTOM (PACKS) ×3 IMPLANT
PATTIES SURGICAL .5 X3 (DISPOSABLE) ×3 IMPLANT
SOL ANTI-FOG 6CC FOG-OUT (MISCELLANEOUS) ×1 IMPLANT
SOL FOG-OUT ANTI-FOG 6CC (MISCELLANEOUS) ×2
SPLINT NASAL SEPTAL BLV .50 ST (MISCELLANEOUS) ×3 IMPLANT
STRAP BODY AND KNEE 60X3 (MISCELLANEOUS) ×3 IMPLANT
STYLUS VIVAER (MISCELLANEOUS) ×2
STYLUS VIVAER BP ELECT (MISCELLANEOUS) ×1 IMPLANT
SUT CHROMIC 3-0 (SUTURE) ×2
SUT CHROMIC 3-0 KS 27XMFL CR (SUTURE) ×1
SUT ETHILON 3-0 KS 30 BLK (SUTURE) ×3 IMPLANT
SUT PLAIN GUT 4-0 (SUTURE) ×3 IMPLANT
SUTURE CHRMC 3-0 KS 27XMFL CR (SUTURE) ×1 IMPLANT
SYR 3ML LL SCALE MARK (SYRINGE) ×3 IMPLANT
TOWEL OR 17X26 4PK STRL BLUE (TOWEL DISPOSABLE) ×3 IMPLANT
WATER STERILE IRR 250ML POUR (IV SOLUTION) ×3 IMPLANT

## 2018-03-29 NOTE — Anesthesia Procedure Notes (Signed)
Procedure Name: Intubation Date/Time: 03/29/2018 8:12 AM Performed by: Jimmy Picket, CRNA Pre-anesthesia Checklist: Patient identified, Emergency Drugs available, Suction available, Patient being monitored and Timeout performed Patient Re-evaluated:Patient Re-evaluated prior to induction Oxygen Delivery Method: Circle system utilized Preoxygenation: Pre-oxygenation with 100% oxygen Induction Type: IV induction Ventilation: Mask ventilation without difficulty Laryngoscope Size: Miller and 2 Grade View: Grade I Tube type: Oral Rae Tube size: 7.0 mm Number of attempts: 1 Placement Confirmation: ETT inserted through vocal cords under direct vision,  positive ETCO2 and breath sounds checked- equal and bilateral Tube secured with: Tape Dental Injury: Teeth and Oropharynx as per pre-operative assessment

## 2018-03-29 NOTE — Anesthesia Postprocedure Evaluation (Signed)
Anesthesia Post Note  Patient: Brenda Oconnor  Procedure(s) Performed: SEPTOPLASTY (Bilateral Nose) INFERIOR TURBINATE REDUCTION (Bilateral Nose) NASAL VALVE REPAIR (Bilateral Nose)  Patient location during evaluation: PACU Anesthesia Type: General Level of consciousness: awake and alert and oriented Pain management: satisfactory to patient Vital Signs Assessment: post-procedure vital signs reviewed and stable Respiratory status: spontaneous breathing, nonlabored ventilation and respiratory function stable Cardiovascular status: blood pressure returned to baseline and stable Postop Assessment: Adequate PO intake and No signs of nausea or vomiting Anesthetic complications: no    Cherly Beach

## 2018-03-29 NOTE — H&P (Signed)
H&P has been reviewed and patient reevaluated, no changes necessary. To be downloaded later.  

## 2018-03-29 NOTE — Op Note (Signed)
03/29/2018  9:31 AM  [id number]   Pre-Op Dx:  Deviated Nasal Septum, Hypertrophic Inferior Turbinates, bilateral nasal valve obstruction  Post-op Dx: Same  Proc: Nasal Septoplasty, Bilateral Partial Reduction Inferior Turbinates, bilateral nasal valve repair  Surg:  Brenda Oconnor  Anes:  GOT  EBL: 50 mL  Comp: None  Findings: Very deviated septum to the right side.  She had a large inferior turbinates.  She had narrowed nasal valves on both sides.  Procedure: With the patient in a comfortable supine position,  general orotracheal anesthesia was induced without difficulty.     The patient received preoperative Afrin spray for topical decongestion and vasoconstriction.  Intravenous prophylactic antibiotics were administered.  At an appropriate level, the patient was placed in a semi-sitting position.  Nasal vibrissae were trimmed.   1% Xylocaine with 1:100,000 epinephrine, 6 cc's, was infiltrated into the anterior floor of the nose, into the nasal spine region, into the membranous columella, and finally into the submucoperichondrial plane of the septum on both sides.  Several minutes were allowed for this to take effect.  Cottoniod pledgetts soaked in Afrin and 4% Xylocaine were placed into both nasal cavities and left while the patient was prepped and draped in the standard fashion.  The materials were removed from the nose and observed to be intact and correct in number.  The nose was inspected with a headlight and zero degree scope with the findings as described above.  A left Killian incision was sharply executed and carried down to the quadrangular cartilage. The mucoperichondrium was elelvated along the quadrangular plate back to the bony-cartilaginous junction. The mucoperiostium was then elevated along the ethmoid plate and the vomer. The boney-catilaginous junction was then split with a freer elevator and the mucoperiosteum was elevated on the opposite side. The mucoperiosteum  was then elevated along the maxillary crest as needed to expose the crooked bone of the crest.  Boney spurs of the vomer and maxillary crest were removed with Lenoria Chime forceps.  The cartilaginous plate was trimmed along its posterior and inferior borders of about 2 mm of cartilage to free it up inferiorly. Some of the deviated ethmoid plate was then fractured and removed with Takahashi forceps to free up the posterior border of the quadrangular plate and allow it to swing back to the midline. The mucosal flaps were placed back into their anatomic position to allow visualization of the airways. The septum now sat in the midline with an improved airway.  A 3-0 Chromic suture on a Keith needle in used to anchor the inferior septum at the nasal spine with a through and through suture. The mucosal flaps are then sutured together using a through and through whip stitch of 4-0 Plain Gut with a mini-Keith needle. This was used to close the [place] incision as well.   The inferior turbinates were then inspected. An incision was created along the inferior aspect of the left inferior turbinate with removal of some of the inferior soft tissue and bone. Electrocautery was used to control bleeding in the area. The remaining turbinate was then outfractured to open up the airway further. There was no significant bleeding noted. The right turbinate was then trimmed and outfractured in a similar fashion.  The airways were then visualized and showed open passageways on both sides that were significantly improved compared to before surgery. There was no signifcant bleeding. Nasal splints were applied to both sides of the septum using Xomed 0.80mm regular sized splints that were trimmed, and  then held in position with a 3-0 Nylon through and through suture.  The nasal valves were then addressed.  Approximately 1/2 mL of 1% Xylocaine with epi 1: 100,000 was used for infiltration in the nasal valves on each side.  The 5 air  machine was brought in with the instruments attached to the foot pedal attached.  The tip was lubricated and placed along the left nasal valve the lateral wall in its midportion.  Gentle lateral retraction was placed and the foot pedal was depressed for 30 seconds.  This had 18 seconds of heating and 12 seconds of cool down time.  This was repeated 2 more times on the left nasal valve above and below the original spot.  This helped to recontour the nasal valve laterally and open up the airway on this side.  There is no significant mucosal burns.  The same procedure was repeated on the right side then with lubricating the wand tip and then placing gentle lateral retraction and depression of the foot pedal for 30 seconds using 18 seconds of heating time and 12 seconds of cool down.  This was repeated 2 more times above and below the original spot.  You could see a better opening of the nasal valve on both sides now.  The patient was turned back over to anesthesia, and awakened, extubated, and taken to the PACU in satisfactory condition.  Dispo:   PACU to home  Plan: Ice, elevation, narcotic analgesia, steroid taper, and prophylactic antibiotics for the duration of indwelling nasal foreign bodies.  She will use Vaseline on the inside lateral wall the nose where the nasal valves were remodeled.  We will reevaluate the patient in the office in 6 days and remove the septal splints.  Return to work in 10 days, strenuous activities in two weeks.   Brenda Oconnor 03/29/2018 9:31 AM

## 2018-03-29 NOTE — Transfer of Care (Signed)
Immediate Anesthesia Transfer of Care Note  Patient: Brenda Oconnor  Procedure(s) Performed: SEPTOPLASTY (Bilateral Nose) INFERIOR TURBINATE REDUCTION (Bilateral Nose) NASAL VALVE REPAIR (Bilateral Nose)  Patient Location: PACU  Anesthesia Type: General ETT  Level of Consciousness: awake, alert  and patient cooperative  Airway and Oxygen Therapy: Patient Spontanous Breathing and Patient connected to supplemental oxygen  Post-op Assessment: Post-op Vital signs reviewed, Patient's Cardiovascular Status Stable, Respiratory Function Stable, Patent Airway and No signs of Nausea or vomiting  Post-op Vital Signs: Reviewed and stable  Complications: No apparent anesthesia complications

## 2018-03-29 NOTE — Anesthesia Preprocedure Evaluation (Signed)
Anesthesia Evaluation  Patient identified by MRN, date of birth, ID band Patient awake    Reviewed: Allergy & Precautions, H&P , NPO status , Patient's Chart, lab work & pertinent test results  Airway Mallampati: I  TM Distance: >3 FB Neck ROM: full    Dental no notable dental hx.    Pulmonary Current Smoker,    Pulmonary exam normal breath sounds clear to auscultation       Cardiovascular Normal cardiovascular exam Rhythm:regular Rate:Normal     Neuro/Psych PSYCHIATRIC DISORDERS    GI/Hepatic GERD  ,  Endo/Other    Renal/GU      Musculoskeletal   Abdominal   Peds  Hematology   Anesthesia Other Findings   Reproductive/Obstetrics                             Anesthesia Physical Anesthesia Plan  ASA: II  Anesthesia Plan: General ETT   Post-op Pain Management:    Induction:   PONV Risk Score and Plan: 2 and Treatment may vary due to age or medical condition, Ondansetron, Dexamethasone and Midazolam  Airway Management Planned:   Additional Equipment:   Intra-op Plan:   Post-operative Plan:   Informed Consent: I have reviewed the patients History and Physical, chart, labs and discussed the procedure including the risks, benefits and alternatives for the proposed anesthesia with the patient or authorized representative who has indicated his/her understanding and acceptance.       Plan Discussed with: CRNA  Anesthesia Plan Comments:         Anesthesia Quick Evaluation

## 2019-02-26 ENCOUNTER — Other Ambulatory Visit
Admission: RE | Admit: 2019-02-26 | Discharge: 2019-02-26 | Disposition: A | Discharge status: Home / Self Care | Payer: BC Managed Care – PPO | Source: Ambulatory Visit | Attending: Physician Assistant | Admitting: Physician Assistant

## 2019-02-26 DIAGNOSIS — R0789 Other chest pain: Secondary | ICD-10-CM | POA: Diagnosis not present

## 2019-02-26 DIAGNOSIS — R0602 Shortness of breath: Secondary | ICD-10-CM | POA: Diagnosis not present

## 2019-02-26 DIAGNOSIS — M79604 Pain in right leg: Secondary | ICD-10-CM | POA: Diagnosis not present

## 2019-02-26 LAB — FIBRIN DERIVATIVES D-DIMER (ARMC ONLY): Fibrin derivatives D-dimer (ARMC): 157.94 ng/mL (FEU) (ref 0.00–499.00)

## 2022-09-08 DIAGNOSIS — E7849 Other hyperlipidemia: Secondary | ICD-10-CM | POA: Diagnosis not present

## 2022-09-08 DIAGNOSIS — R002 Palpitations: Secondary | ICD-10-CM | POA: Diagnosis not present

## 2022-09-08 DIAGNOSIS — Z0000 Encounter for general adult medical examination without abnormal findings: Secondary | ICD-10-CM | POA: Diagnosis not present

## 2022-09-20 ENCOUNTER — Emergency Department
Admission: EM | Admit: 2022-09-20 | Discharge: 2022-09-20 | Disposition: A | Discharge status: Home / Self Care | Payer: 59 | Attending: Student in an Organized Health Care Education/Training Program | Admitting: Student in an Organized Health Care Education/Training Program

## 2022-09-20 ENCOUNTER — Other Ambulatory Visit: Payer: Self-pay

## 2022-09-20 ENCOUNTER — Emergency Department: Payer: 59

## 2022-09-20 DIAGNOSIS — R1031 Right lower quadrant pain: Secondary | ICD-10-CM | POA: Diagnosis not present

## 2022-09-20 LAB — COMPREHENSIVE METABOLIC PANEL
ALT: 13 U/L (ref 0–44)
AST: 11 U/L — ABNORMAL LOW (ref 15–41)
Albumin: 4.1 g/dL (ref 3.5–5.0)
Alkaline Phosphatase: 50 U/L (ref 38–126)
Anion gap: 10 (ref 5–15)
BUN: 9 mg/dL (ref 6–20)
CO2: 23 mmol/L (ref 22–32)
Calcium: 8.8 mg/dL — ABNORMAL LOW (ref 8.9–10.3)
Chloride: 105 mmol/L (ref 98–111)
Creatinine, Ser: 0.68 mg/dL (ref 0.44–1.00)
GFR, Estimated: >60 mL/min (ref >60)
Glucose, Bld: 114 mg/dL — ABNORMAL HIGH (ref 70–99)
Potassium: 3.6 mmol/L (ref 3.5–5.1)
Sodium: 138 mmol/L (ref 135–145)
Total Bilirubin: 0.3 mg/dL (ref 0.3–1.2)
Total Protein: 6.7 g/dL (ref 6.5–8.1)

## 2022-09-20 LAB — URINALYSIS, ROUTINE W REFLEX MICROSCOPIC
Bilirubin Urine: NEGATIVE
Glucose, UA: NEGATIVE mg/dL
Hgb urine dipstick: NEGATIVE
Ketones, ur: NEGATIVE mg/dL
Leukocytes,Ua: NEGATIVE
Nitrite: NEGATIVE
Protein, ur: NEGATIVE mg/dL
Specific Gravity, Urine: 1.019 (ref 1.005–1.030)
pH: 6 (ref 5.0–8.0)

## 2022-09-20 LAB — CBC
HCT: 40.4 % (ref 36.0–46.0)
Hemoglobin: 13.8 g/dL (ref 12.0–15.0)
MCH: 31.1 pg (ref 26.0–34.0)
MCHC: 34.2 g/dL (ref 30.0–36.0)
MCV: 91 fL (ref 80.0–100.0)
Platelets: 416 10*3/uL — ABNORMAL HIGH (ref 150–400)
RBC: 4.44 MIL/uL (ref 3.87–5.11)
RDW: 13.4 % (ref 11.5–15.5)
WBC: 10.5 10*3/uL (ref 4.0–10.5)
nRBC: 0 % (ref 0.0–0.2)

## 2022-09-20 LAB — LIPASE, BLOOD: Lipase: 33 U/L (ref 11–51)

## 2022-09-20 MED ORDER — IOHEXOL 300 MG/ML  SOLN
100.0000 mL | Freq: Once | INTRAMUSCULAR | Status: AC | PRN
  Administered 2022-09-20: 100 mL via INTRAVENOUS

## 2022-09-20 MED ORDER — SODIUM CHLORIDE 0.9 % IV BOLUS
1000.0000 mL | Freq: Once | INTRAVENOUS | Status: AC
  Administered 2022-09-20: 1000 mL via INTRAVENOUS

## 2022-09-20 NOTE — ED Notes (Signed)
Pt given warm blanket denies other needs

## 2022-09-20 NOTE — Discharge Instructions (Signed)

## 2022-09-20 NOTE — ED Triage Notes (Signed)
Pt presents to ER with c/o RLQ abd pain that started around 1700 and has began to radiate all over her abdomen.  Pt reports pain started suddenly.  Denies any n/v/d.  Reports hx of abd hysterectomy, but denies any other surgeries.  Denies any urinary symptoms at this time.  Pt is otherwise A&O x4 and in NAD in triage.

## 2022-09-20 NOTE — ED Provider Notes (Signed)
[facility] Provider Note    Event Date/Time   First MD Initiated Contact with Patient 09/20/22 2113     (approximate)   History   Abdominal Pain   HPI  Brenda Oconnor is a 37 y.o. female status post hysterectomy and left salpingo-oophorectomy presents to the ER for evaluation of sudden onset generalized abdominal pain rating to the right lower quadrant.  No flank pain no dysuria no discharge.  No history of stones.  Does still have her appendix.  Took some Motrin prior to arrival.  Denies any injury or trauma.     Physical Exam   Triage Vital Signs: ED Triage Vitals  Encounter Vitals Group     BP 09/20/22 2030 119/80     Systolic BP Percentile --      Diastolic BP Percentile --      Pulse Rate 09/20/22 2030 94     Resp 09/20/22 2030 18     Temp 09/20/22 2030 97.8 F (36.6 C)     Temp Source 09/20/22 2030 Oral     SpO2 09/20/22 2030 97 %     Weight 09/20/22 2031 154 lb 15.7 oz (70.3 kg)     Height 09/20/22 2031 5\' 10"  (1.778 m)     Head Circumference --      Peak Flow --      Pain Score 09/20/22 2030 6     Pain Loc --      Pain Education --      Exclude from Growth Chart --     Most recent vital signs: Vitals:   09/20/22 2030  BP: 119/80  Pulse: 94  Resp: 18  Temp: 97.8 F (36.6 C)  SpO2: 97%     Constitutional: Alert  Eyes: Conjunctivae are normal.  Head: Atraumatic. Nose: No congestion/rhinnorhea. Mouth/Throat: Mucous membranes are moist.   Neck: Painless ROM.  Cardiovascular:   Good peripheral circulation. Respiratory: Normal respiratory effort.  No retractions.  Gastrointestinal: Soft and nontender.  Musculoskeletal:  no deformity Neurologic:  MAE spontaneously. No gross focal neurologic deficits are appreciated.  Skin:  Skin is warm, dry and intact. No rash noted. Psychiatric: Mood and affect are normal. Speech and behavior are normal.    ED Results / Procedures / Treatments   Labs (all labs ordered are listed,  but only abnormal results are displayed) Labs Reviewed  CBC - Abnormal; Notable for the following components:      Result Value   Platelets 416 (*)    All other components within normal limits  COMPREHENSIVE METABOLIC PANEL - Abnormal; Notable for the following components:   Glucose, Bld 114 (*)    Calcium 8.8 (*)    AST 11 (*)    All other components within normal limits  URINALYSIS, ROUTINE W REFLEX MICROSCOPIC - Abnormal; Notable for the following components:   Color, Urine YELLOW (*)    APPearance CLOUDY (*)    All other components within normal limits  LIPASE, BLOOD     EKG     RADIOLOGY Please see ED Course for my review and interpretation.  I personally reviewed all radiographic images ordered to evaluate for the above acute complaints and reviewed radiology reports and findings.  These findings were personally discussed with the patient.  Please see medical record for radiology report.    PROCEDURES:  Critical Care performed: No  Procedures   MEDICATIONS ORDERED IN ED: Medications  sodium chloride 0.9 % bolus 1,000 mL (1,000 mLs Intravenous New  Bag/Given 09/20/22 2127)  iohexol (OMNIPAQUE) 300 MG/ML solution 100 mL (100 mLs Intravenous Contrast Given 09/20/22 2131)     IMPRESSION / MDM / ASSESSMENT AND PLAN / ED COURSE  I reviewed the triage vital signs and the nursing notes.                              Differential diagnosis includes, but is not limited to, appendicitis, colitis, IBS, musculoskeletal strain, dysuria, stone, pyelonephritis, torsion, cyst  Patient presenting to the ER for evaluation of symptoms as described above.  Based on symptoms, risk factors and considered above differential, this presenting complaint could reflect a potentially life-threatening illness therefore the patient will be placed on continuous pulse oximetry and telemetry for monitoring.  Laboratory evaluation will be sent to evaluate for the above complaints.  ET imaging will be  ordered for the above differential given the patient's acute pain on the right side.  She is afebrile however clinically well-appearing was able to ambulate into the ER.   Clinical Course as of 09/20/22 2203  Tue Sep 20, 2022  2201 Patient with no white count lipase normal no fever urinalysis without any sign of hematuria or infection.  CT imaging without any evidence of appendicitis or adnexal findings.  [PR]  2201 Given reassuring findings does appear stable and appropriate for outpatient follow-up. [PR]    Clinical Course User Index [PR] Willy Eddy, MD     FINAL CLINICAL IMPRESSION(S) / ED DIAGNOSES   Final diagnoses:  Right lower quadrant abdominal pain     Rx / DC Orders   ED Discharge Orders     None        Note:  This document was prepared using Dragon voice recognition software and may include unintentional dictation errors.    Willy Eddy, MD 09/20/22 2203

## 2022-09-21 DIAGNOSIS — Z0000 Encounter for general adult medical examination without abnormal findings: Secondary | ICD-10-CM | POA: Diagnosis not present

## 2022-09-21 DIAGNOSIS — E7849 Other hyperlipidemia: Secondary | ICD-10-CM | POA: Diagnosis not present

## 2022-09-21 DIAGNOSIS — E559 Vitamin D deficiency, unspecified: Secondary | ICD-10-CM | POA: Diagnosis not present

## 2022-09-21 DIAGNOSIS — F419 Anxiety disorder, unspecified: Secondary | ICD-10-CM | POA: Diagnosis not present

## 2022-09-21 DIAGNOSIS — G4700 Insomnia, unspecified: Secondary | ICD-10-CM | POA: Diagnosis not present

## 2023-03-26 ENCOUNTER — Ambulatory Visit (INDEPENDENT_AMBULATORY_CARE_PROVIDER_SITE_OTHER): Payer: 59

## 2023-03-26 ENCOUNTER — Ambulatory Visit
Admission: EM | Admit: 2023-03-26 | Discharge: 2023-03-26 | Disposition: A | Discharge status: Home / Self Care | Payer: 59 | Attending: Emergency Medicine | Admitting: Emergency Medicine

## 2023-03-26 DIAGNOSIS — R509 Fever, unspecified: Secondary | ICD-10-CM | POA: Diagnosis not present

## 2023-03-26 DIAGNOSIS — B9789 Other viral agents as the cause of diseases classified elsewhere: Secondary | ICD-10-CM | POA: Diagnosis not present

## 2023-03-26 DIAGNOSIS — R051 Acute cough: Secondary | ICD-10-CM | POA: Insufficient documentation

## 2023-03-26 DIAGNOSIS — F17210 Nicotine dependence, cigarettes, uncomplicated: Secondary | ICD-10-CM | POA: Diagnosis not present

## 2023-03-26 DIAGNOSIS — J069 Acute upper respiratory infection, unspecified: Secondary | ICD-10-CM | POA: Insufficient documentation

## 2023-03-26 DIAGNOSIS — R062 Wheezing: Secondary | ICD-10-CM | POA: Diagnosis not present

## 2023-03-26 DIAGNOSIS — R059 Cough, unspecified: Secondary | ICD-10-CM | POA: Diagnosis not present

## 2023-03-26 LAB — RESP PANEL BY RT-PCR (FLU A&B, COVID) ARPGX2
Influenza A by PCR: NEGATIVE
Influenza B by PCR: NEGATIVE
SARS Coronavirus 2 by RT PCR: NEGATIVE

## 2023-03-26 LAB — GROUP A STREP BY PCR: Group A Strep by PCR: NOT DETECTED

## 2023-03-26 MED ORDER — AEROCHAMBER MV MISC: 2 refills | Status: AC

## 2023-03-26 MED ORDER — BENZONATATE 100 MG PO CAPS: 200.0000 mg | ORAL_CAPSULE | Freq: Three times a day (TID) | ORAL | 0 refills | Status: DC

## 2023-03-26 MED ORDER — ALBUTEROL SULFATE HFA 108 (90 BASE) MCG/ACT IN AERS: 2.0000 | INHALATION_SPRAY | RESPIRATORY_TRACT | 0 refills | Status: AC | PRN

## 2023-03-26 MED ORDER — PROMETHAZINE-DM 6.25-15 MG/5ML PO SYRP: 5.0000 mL | ORAL_SOLUTION | Freq: Four times a day (QID) | ORAL | 0 refills | Status: DC | PRN

## 2023-03-26 MED ORDER — IPRATROPIUM BROMIDE 0.06 % NA SOLN: 2.0000 | Freq: Four times a day (QID) | NASAL | 12 refills | Status: DC

## 2023-03-26 NOTE — Discharge Instructions (Addendum)
 Your testing today was negative for COVID, influenza, strep, and your chest x-ray was negative for pneumonia.  Your exam is consistent with an upper respiratory infection and it is most likely viral.  Use over-the-counter Tylenol  and/or ibuprofen according the package instructions as needed for any fever or pain.  Use the albuterol  inhaler, with the spacer, 1 to 2 puffs every 4-6 hours, as needed for any shortness of breath or wheezing.  Use the Atrovent  nasal spray, 2 squirts in each nostril every 6 hours, as needed for runny nose and postnasal drip.  Use the Tessalon  Perles every 8 hours during the day.  Take them with a small sip of water.  They may give you some numbness to the base of your tongue or a metallic taste in your mouth, this is normal.  Use the Promethazine  DM cough syrup at bedtime for cough and congestion.  It will make you drowsy so do not take it during the day.  Return for reevaluation or see your primary care provider for any new or worsening symptoms.

## 2023-03-26 NOTE — ED Provider Notes (Signed)
 [facility]    CSN: [id number] Arrival date & time: 03/26/23  0816      History   Chief Complaint Chief Complaint  Patient presents with   Cough   Fever   Generalized Body Aches    HPI Brenda Oconnor is a 38 y.o. female.   HPI  38 year old female with past medical history significant for GERD and smoking presents for evaluation of flulike symptoms that started 2 days ago and consist of fever with a Tmax of one 1.9, runny nose nasal congestion, headache, body aches, sore throat, itchy ears, nonproductive cough with shortness breath and wheezing.  She denies any GI symptoms.  Her son has similar symptoms.  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  Past Medical History:  Diagnosis Date   Complication of anesthesia    jaw locked from intubation?   GERD (gastroesophageal reflux disease)     There are no active problems to display for this patient.   Past Surgical History:  Procedure Laterality Date   ABDOMINAL HYSTERECTOMY     BREAST SURGERY     SEPTOPLASTY Bilateral 03/29/2018   Procedure: SEPTOPLASTY;  Surgeon: Edda Mt, MD;  Location: [facility];  Service: ENT;  Laterality: Bilateral;   TURBINATE REDUCTION Bilateral 03/29/2018   Procedure: INFERIOR TURBINATE REDUCTION;  Surgeon: Edda Mt, MD;  Location: [facility];  Service: ENT;  Laterality: Bilateral;   WISDOM TOOTH EXTRACTION      OB History   No obstetric history on file.      Home Medications    Prior to Admission medications   Medication Sig Start Date End Date Taking? Authorizing Provider  albuterol  (VENTOLIN  HFA) 108 (90 Base) MCG/ACT inhaler Inhale 2 puffs into the lungs every 4 (four) hours as needed. 03/26/23  Yes Bernardino Ditch, NP  benzonatate  (TESSALON ) 100 MG capsule Take 2 capsules (200 mg total) by mouth every 8 (eight) hours. 03/26/23  Yes Bernardino Ditch, NP  ipratropium (ATROVENT ) 0.06 % nasal  spray Place 2 sprays into both nostrils 4 (four) times daily. 03/26/23  Yes Bernardino Ditch, NP  promethazine -dextromethorphan (PROMETHAZINE -DM) 6.25-15 MG/5ML syrup Take 5 mLs by mouth 4 (four) times daily as needed. 03/26/23  Yes Bernardino Ditch, NP  QUEtiapine (SEROQUEL) 25 MG tablet Take by mouth. 02/27/23  Yes [provider]  Spacer/Aero-Holding Chambers (AEROCHAMBER MV) inhaler Use as instructed 03/26/23  Yes Bernardino Ditch, NP  escitalopram (LEXAPRO) 10 MG tablet Take by mouth. 03/10/17 03/29/18  [provider]  Multiple Vitamin (MULTIVITAMIN) tablet Take 1 tablet by mouth daily.    [provider]  zolpidem (AMBIEN CR) 6.25 MG CR tablet Take by mouth. 05/01/17 05/31/17  [provider]    Family History Family History  Problem Relation Age of Onset   Thyroid disease Mother     Social History Social History   Tobacco Use   Smoking status: Every Day    Current packs/day: 0.50    Average packs/day: 0.5 packs/day for 17.0 years (8.5 ttl pk-yrs)    Types: Cigarettes   Smokeless tobacco: Never  Substance Use Topics   Alcohol use: Yes    Comment: rarely     Allergies   Amoxicillin-pot clavulanate, Buspirone, and Escitalopram   Review of Systems Review of Systems  Constitutional:  Positive for fever.  HENT:  Positive for congestion, ear pain, rhinorrhea and sore throat.   Respiratory:  Positive for cough, shortness of breath and  wheezing.   Gastrointestinal:  Negative for diarrhea, nausea and vomiting.  Musculoskeletal:  Positive for arthralgias and myalgias.  Neurological:  Positive for headaches.     Physical Exam Triage Vital Signs ED Triage Vitals  Encounter Vitals Group     BP 03/26/23 0831 112/79     Systolic BP Percentile --      Diastolic BP Percentile --      Pulse Rate 03/26/23 0831 87     Resp 03/26/23 0831 17     Temp 03/26/23 0831 98.8 F (37.1 C)     Temp Source 03/26/23 0831 Oral     SpO2 03/26/23 0831 97 %     Weight --       Height --      Head Circumference --      Peak Flow --      Pain Score 03/26/23 0830 3     Pain Loc --      Pain Education --      Exclude from Growth Chart --    No data found.  Updated Vital Signs BP 112/79 (BP Location: Right Arm)   Pulse 87   Temp 98.8 F (37.1 C) (Oral)   Resp 17   SpO2 97%   Visual Acuity Right Eye Distance:   Left Eye Distance:   Bilateral Distance:    Right Eye Near:   Left Eye Near:    Bilateral Near:     Physical Exam Vitals and nursing note reviewed.  Constitutional:      Appearance: Normal appearance. She is not ill-appearing.  HENT:     Head: Normocephalic and atraumatic.     Right Ear: Tympanic membrane, ear canal and external ear normal. There is no impacted cerumen.     Left Ear: Tympanic membrane, ear canal and external ear normal. There is no impacted cerumen.     Nose: Congestion and rhinorrhea present.     Comments: Nasal mucosa is erythematous and edematous with clear discharge in both nares.    Mouth/Throat:     Mouth: Mucous membranes are moist.     Pharynx: Oropharynx is clear. No oropharyngeal exudate or posterior oropharyngeal erythema.  Cardiovascular:     Rate and Rhythm: Normal rate and regular rhythm.     Pulses: Normal pulses.     Heart sounds: Normal heart sounds. No murmur heard.    No friction rub. No gallop.  Pulmonary:     Effort: Pulmonary effort is normal.     Breath sounds: Normal breath sounds. No wheezing, rhonchi or rales.  Musculoskeletal:     Cervical back: Normal range of motion and neck supple. No tenderness.  Lymphadenopathy:     Cervical: No cervical adenopathy.  Skin:    General: Skin is warm and dry.     Capillary Refill: Capillary refill takes less than 2 seconds.     Findings: No rash.  Neurological:     General: No focal deficit present.     Mental Status: She is alert and oriented to person, place, and time.      UC Treatments / Results  Labs (all labs ordered are listed, but only  abnormal results are displayed) Labs Reviewed  RESP PANEL BY RT-PCR (FLU A&B, COVID) [id number]  GROUP A STREP BY PCR    EKG   Radiology DG Chest 2 View Result Date: 03/26/2023 CLINICAL DATA:  Cough, fever, and wheezing for 2 days. EXAM: CHEST - 2 VIEW COMPARISON:  02/26/2005 FINDINGS: The heart size  and mediastinal contours are within normal limits. Both lungs are clear. Stable mild thoracic dextroscoliosis. IMPRESSION: No active cardiopulmonary disease. Electronically Signed   By: Norleen DELENA Kil M.D.   On: 03/26/2023 09:22    Procedures Procedures (including critical care time)  Medications Ordered in UC Medications - No data to display  Initial Impression / Assessment and Plan / UC Course  I have reviewed the triage vital signs and the nursing notes.  Pertinent labs & imaging results that were available during my care of the patient were reviewed by me and considered in my medical decision making (see chart for details).   Patient is a pleasant, nontoxic-appearing 38 year old female presenting for evaluation of flulike symptoms as outlined HPI above.  She also endorses a cough with shortness breath and wheezing though she is able to speak in full sentence without dyspnea or tachypnea.  Respiratory rate at triage was 17 with a room air oxygen saturation of 97%.  Her cardiopulmonary exam reveals clear lung sounds all fields.  However, she and her son are both been exposed to walking pneumonia so I will obtain a chest x-ray to rule any acute cardiopulmonary pathology.  I will also order a strep PCR due to her sore throat and a COVID and flu PCR due to her other respiratory symptoms and inflammation of her upper respiratory tract present on physical exam.  Chest x-ray independently reviewed and evaluated by me.  Impression: Lung fields are well aerated without evidence of infiltrate or effusion.  Cardiomediastinal silhouette appears normal.  Radiology overread is pending. Radiology impression  states active cardiopulmonary disease.  Strep PCR is negative.  Respiratory panel is negative for COVID and influenza.  I will discharge patient home with diagnosis of viral URI with a cough with prescription for Atrovent  nasal spray to help with nasal congestion and Tessalon  Perles (DM cough syrup for cough and congestion.  She may use over-the-counter Tylenol  and/or ibuprofen according to package instructions as needed for any fever or bodyaches.  Return precautions reviewed.  I will also prescribe an albuterol  inhaler that she can use for shortness of breath and wheezing, 1 to 2 puffs every 4-6 hours as needed.   Final Clinical Impressions(s) / UC Diagnoses   Final diagnoses:  Acute cough  Viral URI with cough     Discharge Instructions      Your testing today was negative for COVID, influenza, strep, and your chest x-ray was negative for pneumonia.  Your exam is consistent with an upper respiratory infection and it is most likely viral.  Use over-the-counter Tylenol  and/or ibuprofen according the package instructions as needed for any fever or pain.  Use the albuterol  inhaler, with the spacer, 1 to 2 puffs every 4-6 hours, as needed for any shortness of breath or wheezing.  Use the Atrovent  nasal spray, 2 squirts in each nostril every 6 hours, as needed for runny nose and postnasal drip.  Use the Tessalon  Perles every 8 hours during the day.  Take them with a small sip of water.  They may give you some numbness to the base of your tongue or a metallic taste in your mouth, this is normal.  Use the Promethazine  DM cough syrup at bedtime for cough and congestion.  It will make you drowsy so do not take it during the day.  Return for reevaluation or see your primary care provider for any new or worsening symptoms.      ED Prescriptions     Medication Sig  Dispense Auth. Provider   benzonatate  (TESSALON ) 100 MG capsule Take 2 capsules (200 mg total) by mouth every 8 (eight)  hours. 21 capsule Bernardino Ditch, NP   ipratropium (ATROVENT ) 0.06 % nasal spray Place 2 sprays into both nostrils 4 (four) times daily. 15 mL Bernardino Ditch, NP   promethazine -dextromethorphan (PROMETHAZINE -DM) 6.25-15 MG/5ML syrup Take 5 mLs by mouth 4 (four) times daily as needed. 118 mL Bernardino Ditch, NP   Spacer/Aero-Holding Chambers (AEROCHAMBER MV) inhaler Use as instructed 1 each Bernardino Ditch, NP   albuterol  (VENTOLIN  HFA) 108 (90 Base) MCG/ACT inhaler Inhale 2 puffs into the lungs every 4 (four) hours as needed. 18 g Bernardino Ditch, NP      PDMP not reviewed this encounter.   Bernardino Ditch, NP 03/26/23 [phone]

## 2023-03-26 NOTE — ED Triage Notes (Signed)
 Sx x 2 days  Bodyaches  Fever--- taking IBU. Last dose last night Cough Sore throat Headache

## 2023-04-10 DIAGNOSIS — Z90710 Acquired absence of both cervix and uterus: Secondary | ICD-10-CM | POA: Diagnosis not present

## 2023-04-10 DIAGNOSIS — Z01419 Encounter for gynecological examination (general) (routine) without abnormal findings: Secondary | ICD-10-CM | POA: Diagnosis not present

## 2023-04-10 DIAGNOSIS — B3731 Acute candidiasis of vulva and vagina: Secondary | ICD-10-CM | POA: Diagnosis not present

## 2023-04-10 DIAGNOSIS — R102 Pelvic and perineal pain: Secondary | ICD-10-CM | POA: Diagnosis not present

## 2023-04-10 DIAGNOSIS — G8929 Other chronic pain: Secondary | ICD-10-CM | POA: Diagnosis not present

## 2023-04-17 DIAGNOSIS — J019 Acute sinusitis, unspecified: Secondary | ICD-10-CM | POA: Diagnosis not present

## 2023-05-23 DIAGNOSIS — J029 Acute pharyngitis, unspecified: Secondary | ICD-10-CM | POA: Diagnosis not present

## 2023-09-18 DIAGNOSIS — Z0000 Encounter for general adult medical examination without abnormal findings: Secondary | ICD-10-CM | POA: Diagnosis not present

## 2023-09-18 DIAGNOSIS — E7849 Other hyperlipidemia: Secondary | ICD-10-CM | POA: Diagnosis not present

## 2023-12-11 ENCOUNTER — Encounter: Payer: Self-pay | Admitting: Emergency Medicine

## 2023-12-11 ENCOUNTER — Ambulatory Visit
Admission: EM | Admit: 2023-12-11 | Discharge: 2023-12-11 | Disposition: A | Discharge status: Home / Self Care | Attending: Family Medicine | Admitting: Family Medicine

## 2023-12-11 DIAGNOSIS — J069 Acute upper respiratory infection, unspecified: Secondary | ICD-10-CM | POA: Diagnosis not present

## 2023-12-11 LAB — GROUP A STREP BY PCR: Group A Strep by PCR: NOT DETECTED

## 2023-12-11 NOTE — ED Triage Notes (Signed)
 Pt presents with nasal congestion, bodyaches and sore throat x 4 days. Pt has taken Zycam and Mucinex for her symptoms.

## 2023-12-11 NOTE — Discharge Instructions (Addendum)
 Your strep was negative. You have a viral respiratory infection that will gradually improve over the next 7-10 days. Cough may last up to 3 weeks.    You can take Tylenol  and/or Ibuprofen as needed for fever reduction and pain relief.    For cough: honey 1/2 to 1 teaspoon (you can dilute the honey in water or another fluid).  You can also use guaifenesin and dextromethorphan for cough.  You can use a humidifier for chest congestion and cough.  If you don't have a humidifier, you can sit in the bathroom with the hot shower running.      For sore throat: try warm salt water gargles, Mucinex sore throat cough drops or cepacol lozenges, throat spray, warm tea or water with lemon/honey, popsicles or ice, or OTC cold relief medicine for throat discomfort. You can also purchase chloraseptic spray at the pharmacy or dollar store.    For congestion: take a daily anti-histamine like Zyrtec, Claritin, and a oral decongestant, such as pseudoephedrine.  You can also use Flonase 1-2 sprays in each nostril daily. Afrin is also a good option, if you do not have high blood pressure.    It is important to stay hydrated: drink plenty of fluids (water, gatorade/powerade/pedialyte, juices, or teas) to keep your throat moisturized and help further relieve irritation/discomfort.    Return or go to the Emergency Department if symptoms worsen or do not improve in the next few days

## 2023-12-11 NOTE — ED Provider Notes (Signed)
 [facility]    CSN: [id number] Arrival date & time: 12/11/23  1124      History   Chief Complaint Chief Complaint  Patient presents with   Sore Throat   Generalized Body Aches   Nasal Congestion    HPI Brenda Oconnor is a 38 y.o. female.   HPI  History obtained from the patient. Brenda Oconnor presents for sore throat, nasal congestion, body aches, white patches on the back of her throat and 4 days. Zicam and Mucinex. She is 2.5 weeks post-op from breast augmentation.   No fever, vomiting, diarrhea, slight headache or abdominal pain. Endroses ear fullness.  She has not been around sick kids but her son attends school.  Has history of mono/.       Past Medical History:  Diagnosis Date   Complication of anesthesia    jaw locked from intubation?   GERD (gastroesophageal reflux disease)     There are no active problems to display for this patient.   Past Surgical History:  Procedure Laterality Date   ABDOMINAL HYSTERECTOMY     BREAST SURGERY     SEPTOPLASTY Bilateral 03/29/2018   Procedure: SEPTOPLASTY;  Surgeon: Edda Mt, MD;  Location: [facility];  Service: ENT;  Laterality: Bilateral;   TURBINATE REDUCTION Bilateral 03/29/2018   Procedure: INFERIOR TURBINATE REDUCTION;  Surgeon: Edda Mt, MD;  Location: [facility];  Service: ENT;  Laterality: Bilateral;   WISDOM TOOTH EXTRACTION      OB History   No obstetric history on file.      Home Medications    Prior to Admission medications   Medication Sig Start Date End Date Taking? Authorizing Provider  albuterol  (VENTOLIN  HFA) 108 (90 Base) MCG/ACT inhaler Inhale 2 puffs into the lungs every 4 (four) hours as needed. 03/26/23   Bernardino Ditch, NP  escitalopram (LEXAPRO) 10 MG tablet Take by mouth. 03/10/17 03/29/18  [provider]  Multiple Vitamin (MULTIVITAMIN) tablet Take 1 tablet by mouth daily.    [provider]  QUEtiapine (SEROQUEL) 25 MG tablet Take by mouth.  02/27/23   [provider]  Spacer/Aero-Holding Chambers (AEROCHAMBER MV) inhaler Use as instructed 03/26/23   Bernardino Ditch, NP  zolpidem (AMBIEN CR) 6.25 MG CR tablet Take by mouth. 05/01/17 05/31/17  [provider]    Family History Family History  Problem Relation Age of Onset   Thyroid disease Mother     Social History Social History   Tobacco Use   Smoking status: Every Day    Current packs/day: 0.50    Average packs/day: 0.5 packs/day for 17.0 years (8.5 ttl pk-yrs)    Types: Cigarettes   Smokeless tobacco: Never  Vaping Use   Vaping status: Never Used  Substance Use Topics   Alcohol use: Yes    Comment: rarely     Allergies   Amoxicillin-pot clavulanate, Buspirone, and Escitalopram   Review of Systems Review of Systems: negative unless otherwise stated in HPI.      Physical Exam Triage Vital Signs ED Triage Vitals  Encounter Vitals Group     BP 12/11/23 1208 118/81     Girls Systolic BP Percentile --      Girls Diastolic BP Percentile --      Boys Systolic BP Percentile --      Boys Diastolic BP Percentile --      Pulse Rate 12/11/23 1208 76     Resp 12/11/23 1208 16     Temp 12/11/23 1208  98.9 F (37.2 C)     Temp Source 12/11/23 1208 Oral     SpO2 12/11/23 1208 97 %     Weight 12/11/23 1205 159 lb (72.1 kg)     Height --      Head Circumference --      Peak Flow --      Pain Score 12/11/23 1206 3     Pain Loc --      Pain Education --      Exclude from Growth Chart --    No data found.  Updated Vital Signs BP 118/81 (BP Location: Left Arm)   Pulse 76   Temp 98.9 F (37.2 C) (Oral)   Resp 16   Wt 72.1 kg   SpO2 97%   BMI 22.81 kg/m   Visual Acuity Right Eye Distance:   Left Eye Distance:   Bilateral Distance:    Right Eye Near:   Left Eye Near:    Bilateral Near:     Physical Exam GEN:     alert, non-toxic appearing female in no distress    HENT:  mucus membranes moist, oropharyngeal without lesions, mild  erythema, no tonsillar hypertrophy, small single exudate on the right lower tonsillar pole, moderate erythematous edematous turbinates, post nasal drainage visible in oropharynx, clear nasal discharge, bilateral TM normal EYES:   no scleral injection or discharge NECK:  normal ROM, posterior bilateral cervical lymphadenopathy, no meningismus   RESP:  no increased work of breathing, clear to auscultation bilaterally CVS:   regular rate and rhythm Skin:   warm and dry, no rash on visible skin    UC Treatments / Results  Labs (all labs ordered are listed, but only abnormal results are displayed) Labs Reviewed  GROUP A STREP BY PCR    EKG   Radiology No results found.   Procedures Procedures (including critical care time)  Medications Ordered in UC Medications - No data to display  Initial Impression / Assessment and Plan / UC Course  I have reviewed the triage vital signs and the nursing notes.  Pertinent labs & imaging results that were available during my care of the patient were reviewed by me and considered in my medical decision making (see chart for details).       Pt is a 38 y.o. female who presents for 4 days of respiratory symptoms. Brenda Oconnor is afebrile here without recent antipyretics. Satting well on room air. Overall pt is non-toxic appearing, well hydrated, without respiratory distress. Pulmonary exam is unremarkable.  COVID and mono testing declined.  Says she will test for COVID at home.  Strep PCR is negative. Suspect viral respiratory illness. Discussed symptomatic treatment.  Explained lack of efficacy of antibiotics in viral disease. Offered nasal spray but she declined.  Typical duration of symptoms discussed.   Return and ED precautions given and voiced understanding. Discussed MDM, treatment plan and plan for follow-up with patient  who agrees with plan.     Final Clinical Impressions(s) / UC Diagnoses   Final diagnoses:  Viral URI with cough      Discharge Instructions      Your strep was negative. You have a viral respiratory infection that will gradually improve over the next 7-10 days. Cough may last up to 3 weeks.    You can take Tylenol  and/or Ibuprofen as needed for fever reduction and pain relief.    For cough: honey 1/2 to 1 teaspoon (you can dilute the honey in water or another fluid).  You can also use guaifenesin and dextromethorphan for cough.  You can use a humidifier for chest congestion and cough.  If you don't have a humidifier, you can sit in the bathroom with the hot shower running.      For sore throat: try warm salt water gargles, Mucinex sore throat cough drops or cepacol lozenges, throat spray, warm tea or water with lemon/honey, popsicles or ice, or OTC cold relief medicine for throat discomfort. You can also purchase chloraseptic spray at the pharmacy or dollar store.    For congestion: take a daily anti-histamine like Zyrtec, Claritin, and a oral decongestant, such as pseudoephedrine.  You can also use Flonase 1-2 sprays in each nostril daily. Afrin is also a good option, if you do not have high blood pressure.    It is important to stay hydrated: drink plenty of fluids (water, gatorade/powerade/pedialyte, juices, or teas) to keep your throat moisturized and help further relieve irritation/discomfort.    Return or go to the Emergency Department if symptoms worsen or do not improve in the next few days      ED Prescriptions   None    PDMP not reviewed this encounter.   Chon Buhl, DO 12/11/23 1300

## 2023-12-26 DIAGNOSIS — N9089 Other specified noninflammatory disorders of vulva and perineum: Secondary | ICD-10-CM | POA: Diagnosis not present
